# Patient Record
Sex: Male | Born: 2000 | Race: White | Hispanic: No | Marital: Single | Smoking: Never smoker
Health system: Southern US, Community
[De-identification: ages and names within clinical notes are randomized; demographics above are authoritative.]

## PROBLEM LIST (undated history)

## (undated) DIAGNOSIS — F329 Major depressive disorder, single episode, unspecified: Secondary | ICD-10-CM

## (undated) DIAGNOSIS — F32A Depression, unspecified: Secondary | ICD-10-CM

---
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
## 2018-04-27 ENCOUNTER — Encounter: Payer: Self-pay | Admitting: *Deleted

## 2018-04-28 ENCOUNTER — Ambulatory Visit: Payer: Self-pay | Admitting: Cardiovascular Disease

## 2018-05-02 ENCOUNTER — Ambulatory Visit (HOSPITAL_COMMUNITY)
Admission: RE | Admit: 2018-05-02 | Discharge: 2018-05-02 | Disposition: A | Discharge status: Home / Self Care | Payer: Self-pay | Source: Ambulatory Visit | Attending: Internal Medicine | Admitting: Internal Medicine

## 2018-05-02 ENCOUNTER — Other Ambulatory Visit (HOSPITAL_COMMUNITY): Payer: Self-pay | Admitting: Internal Medicine

## 2018-05-02 DIAGNOSIS — M5440 Lumbago with sciatica, unspecified side: Secondary | ICD-10-CM

## 2018-05-02 DIAGNOSIS — M545 Low back pain: Secondary | ICD-10-CM | POA: Insufficient documentation

## 2018-05-02 DIAGNOSIS — R0602 Shortness of breath: Secondary | ICD-10-CM

## 2018-11-17 IMAGING — DX DG CHEST 2V
2 series · 2 of 2 positions shown · non-contrast
Comparison: None.

CLINICAL DATA: Upper and lower back pain 6 months. Right-sided
chest pain. Currently vapes.

EXAM:
CHEST - 2 VIEW

[chest pa]
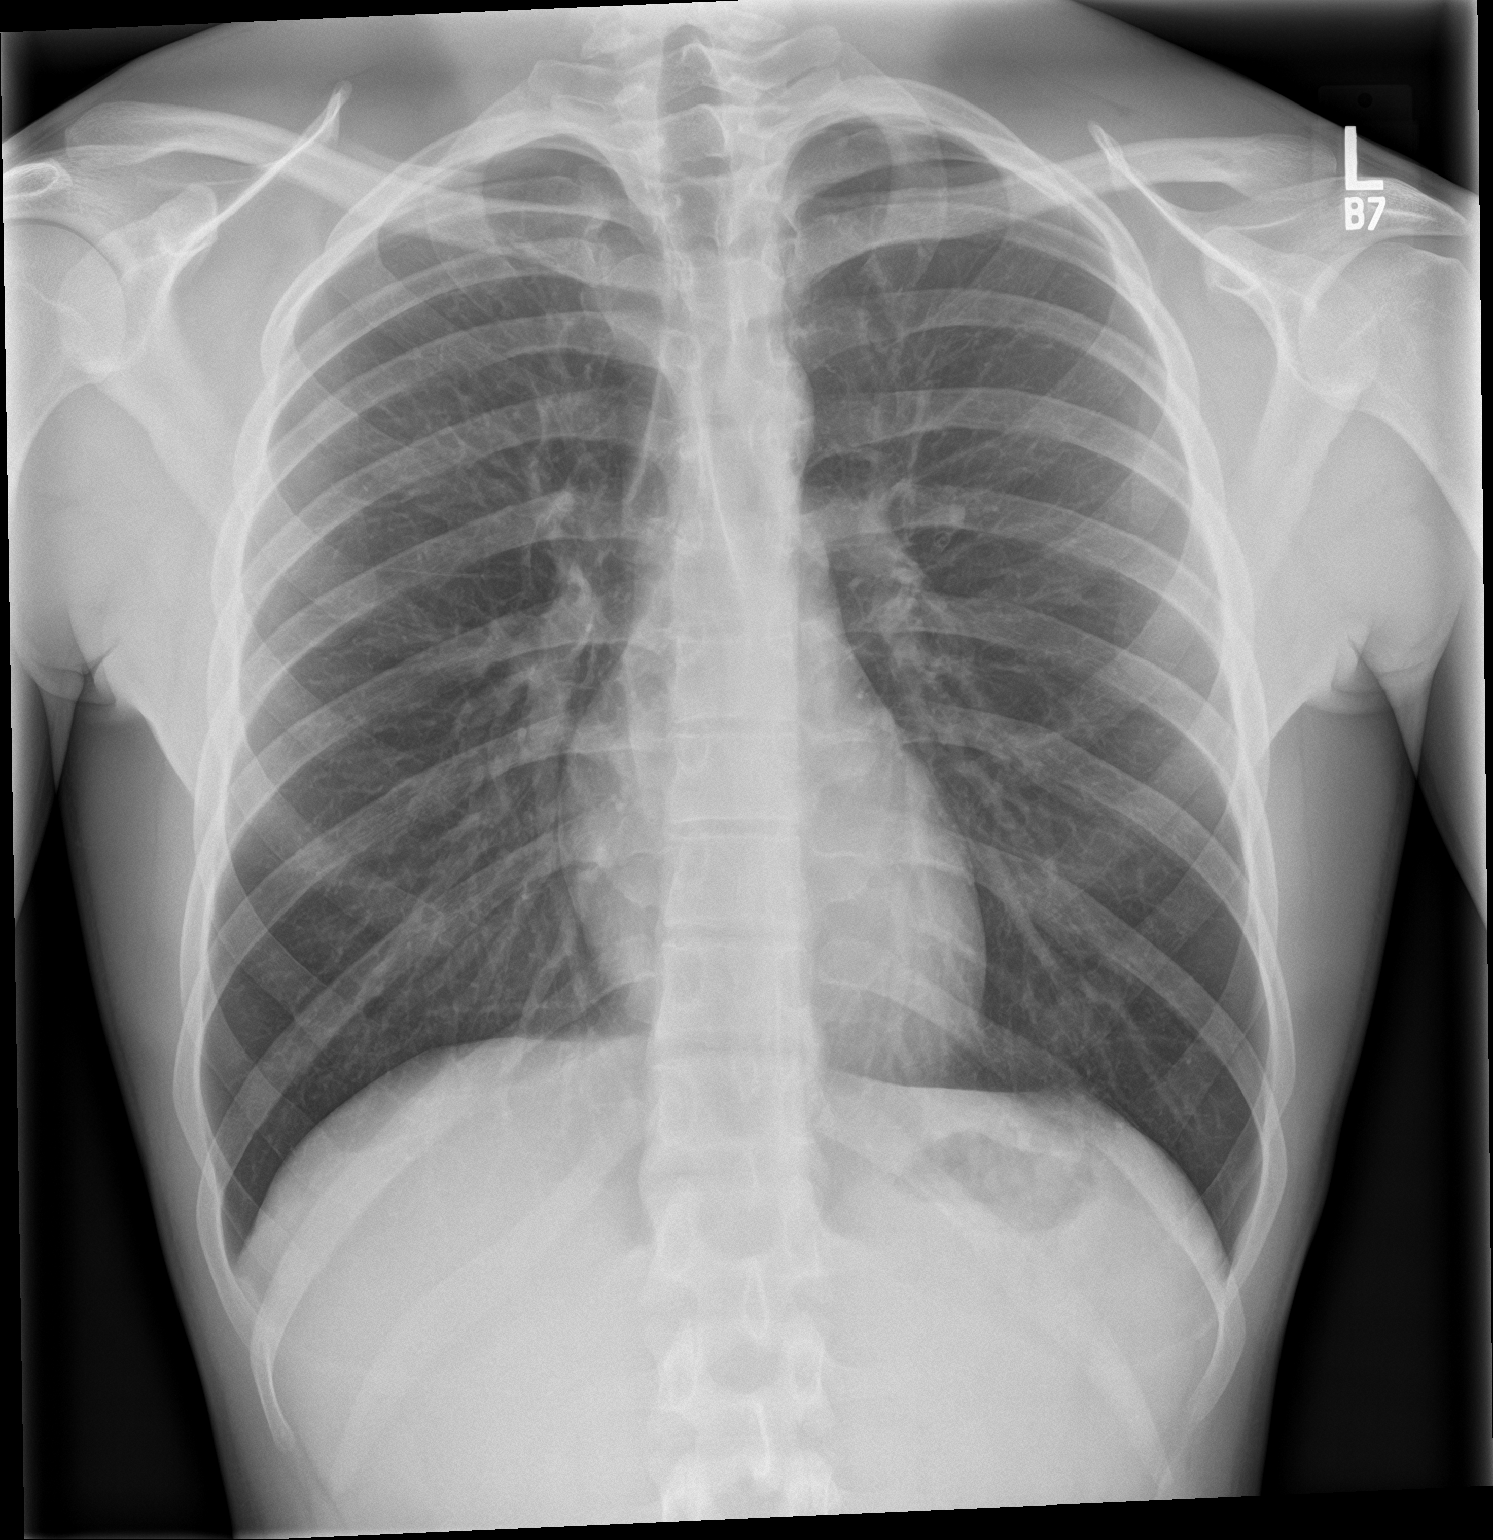

[chest lat]
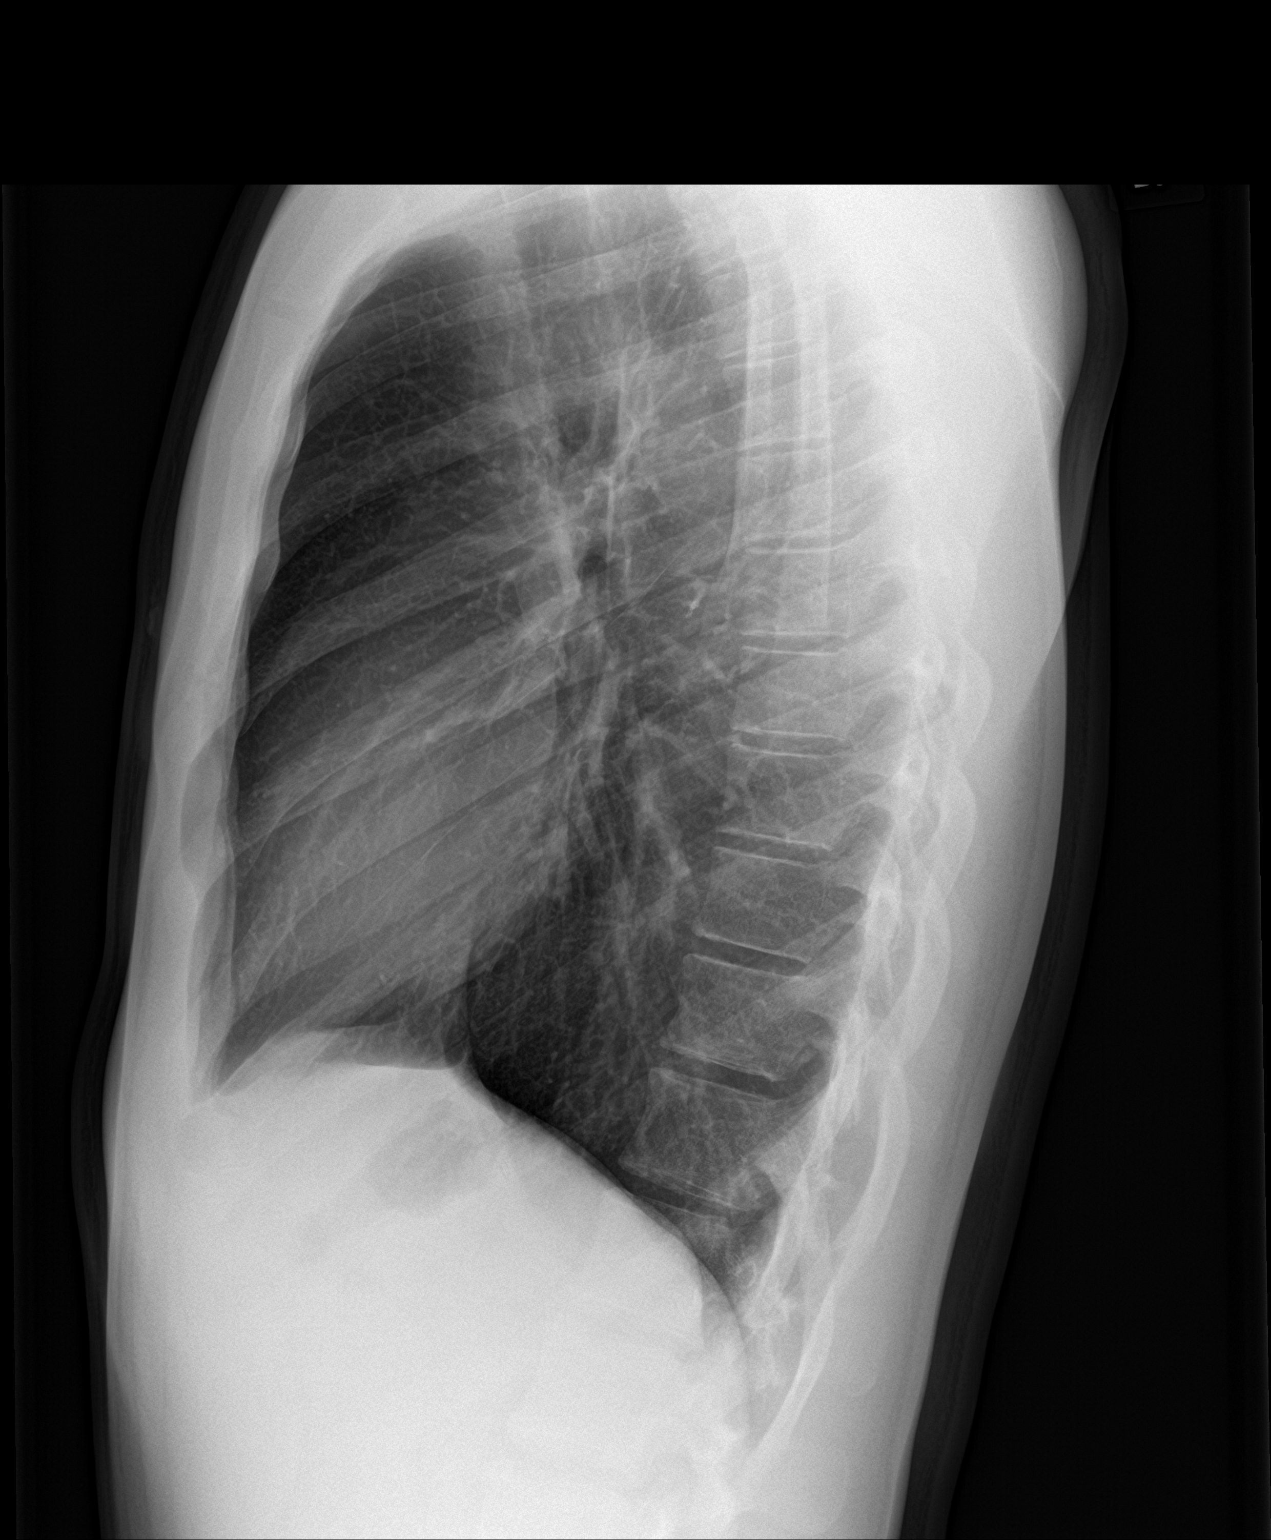

[2 of 2 positions shown; findings below may reference images not displayed]

FINDINGS: The heart size and mediastinal contours are within normal limits.
Both lungs are clear. The visualized skeletal structures are
unremarkable.
IMPRESSION: No active cardiopulmonary disease.

## 2020-04-23 ENCOUNTER — Other Ambulatory Visit: Payer: Medicaid Other

## 2020-04-23 ENCOUNTER — Other Ambulatory Visit: Payer: Self-pay

## 2020-04-23 DIAGNOSIS — Z20822 Contact with and (suspected) exposure to covid-19: Secondary | ICD-10-CM

## 2020-04-25 LAB — SARS-COV-2, NAA 2 DAY TAT

## 2020-04-25 LAB — NOVEL CORONAVIRUS, NAA: SARS-CoV-2, NAA: NOT DETECTED

## 2021-01-04 ENCOUNTER — Encounter (HOSPITAL_COMMUNITY): Payer: Self-pay | Admitting: Nurse Practitioner

## 2021-01-04 ENCOUNTER — Inpatient Hospital Stay (HOSPITAL_COMMUNITY)
Admission: RE | Admit: 2021-01-04 | Discharge: 2021-01-10 | DRG: 885 | Disposition: A | Discharge status: Home / Self Care | Payer: No Typology Code available for payment source | Attending: Psychiatry | Admitting: Psychiatry

## 2021-01-04 ENCOUNTER — Other Ambulatory Visit: Payer: Self-pay

## 2021-01-04 DIAGNOSIS — Z20822 Contact with and (suspected) exposure to covid-19: Secondary | ICD-10-CM | POA: Diagnosis present

## 2021-01-04 DIAGNOSIS — F411 Generalized anxiety disorder: Secondary | ICD-10-CM | POA: Diagnosis present

## 2021-01-04 DIAGNOSIS — Z818 Family history of other mental and behavioral disorders: Secondary | ICD-10-CM

## 2021-01-04 DIAGNOSIS — Z9151 Personal history of suicidal behavior: Secondary | ICD-10-CM

## 2021-01-04 DIAGNOSIS — F332 Major depressive disorder, recurrent severe without psychotic features: Principal | ICD-10-CM | POA: Diagnosis present

## 2021-01-04 DIAGNOSIS — G4700 Insomnia, unspecified: Secondary | ICD-10-CM | POA: Diagnosis present

## 2021-01-04 LAB — RAPID URINE DRUG SCREEN, HOSP PERFORMED
Amphetamines: NOT DETECTED
Barbiturates: NOT DETECTED
Benzodiazepines: NOT DETECTED
Cocaine: NOT DETECTED
Opiates: NOT DETECTED
Tetrahydrocannabinol: POSITIVE — AB

## 2021-01-04 LAB — CBC
HCT: 47 % (ref 39.0–52.0)
Hemoglobin: 15.3 g/dL (ref 13.0–17.0)
MCH: 29.8 pg (ref 26.0–34.0)
MCHC: 32.6 g/dL (ref 30.0–36.0)
MCV: 91.6 fL (ref 80.0–100.0)
Platelets: 217 10*3/uL (ref 150–400)
RBC: 5.13 MIL/uL (ref 4.22–5.81)
RDW: 12.4 % (ref 11.5–15.5)
WBC: 7.7 10*3/uL (ref 4.0–10.5)
nRBC: 0 % (ref 0.0–0.2)

## 2021-01-04 LAB — LIPID PANEL
Cholesterol: 128 mg/dL (ref 0–200)
HDL: 44 mg/dL (ref >40)
LDL Cholesterol: 68 mg/dL (ref 0–99)
Total CHOL/HDL Ratio: 2.9 RATIO
Triglycerides: 80 mg/dL (ref <150)
VLDL: 16 mg/dL (ref 0–40)

## 2021-01-04 LAB — COMPREHENSIVE METABOLIC PANEL
ALT: 13 U/L (ref 0–44)
AST: 15 U/L (ref 15–41)
Albumin: 4.5 g/dL (ref 3.5–5.0)
Alkaline Phosphatase: 57 U/L (ref 38–126)
Anion gap: 6 (ref 5–15)
BUN: 19 mg/dL (ref 6–20)
CO2: 34 mmol/L — ABNORMAL HIGH (ref 22–32)
Calcium: 9.6 mg/dL (ref 8.9–10.3)
Chloride: 101 mmol/L (ref 98–111)
Creatinine, Ser: 0.98 mg/dL (ref 0.61–1.24)
GFR, Estimated: >60 mL/min (ref >60)
Glucose, Bld: 97 mg/dL (ref 70–99)
Potassium: 3.6 mmol/L (ref 3.5–5.1)
Sodium: 141 mmol/L (ref 135–145)
Total Bilirubin: 1.7 mg/dL — ABNORMAL HIGH (ref 0.3–1.2)
Total Protein: 7.3 g/dL (ref 6.5–8.1)

## 2021-01-04 LAB — RESP PANEL BY RT-PCR (FLU A&B, COVID) ARPGX2
Influenza A by PCR: NEGATIVE
Influenza B by PCR: NEGATIVE
SARS Coronavirus 2 by RT PCR: NEGATIVE

## 2021-01-04 LAB — ETHANOL: Alcohol, Ethyl (B): <10 mg/dL (ref <10)

## 2021-01-04 LAB — TSH: TSH: 0.595 u[IU]/mL (ref 0.350–4.500)

## 2021-01-04 MED ORDER — TRAZODONE HCL 50 MG PO TABS
50.0000 mg | ORAL_TABLET | Freq: Every evening | ORAL | Status: DC | PRN
  Filled 2021-01-04: qty 1

## 2021-01-04 MED ORDER — HYDROXYZINE HCL 25 MG PO TABS
25.0000 mg | ORAL_TABLET | Freq: Three times a day (TID) | ORAL | Status: DC | PRN
  Administered 2021-01-04 – 2021-01-05 (×2): 25 mg via ORAL
  Filled 2021-01-04 (×4): qty 1

## 2021-01-04 MED ORDER — ACETAMINOPHEN 325 MG PO TABS: 650.0000 mg | ORAL_TABLET | Freq: Four times a day (QID) | ORAL | Status: DC | PRN

## 2021-01-04 MED ORDER — CITALOPRAM HYDROBROMIDE 20 MG PO TABS
20.0000 mg | ORAL_TABLET | Freq: Every day | ORAL | Status: DC
  Administered 2021-01-04 – 2021-01-10 (×7): 20 mg via ORAL
  Filled 2021-01-04 (×9): qty 1

## 2021-01-04 MED ORDER — ALUM & MAG HYDROXIDE-SIMETH 200-200-20 MG/5ML PO SUSP: 30.0000 mL | ORAL | Status: DC | PRN

## 2021-01-04 MED ORDER — MAGNESIUM HYDROXIDE 400 MG/5ML PO SUSP: 30.0000 mL | Freq: Every day | ORAL | Status: DC | PRN

## 2021-01-04 MED ORDER — TRAZODONE HCL 50 MG PO TABS
25.0000 mg | ORAL_TABLET | Freq: Every evening | ORAL | Status: DC | PRN
  Administered 2021-01-07: 25 mg via ORAL
  Filled 2021-01-04 (×2): qty 1

## 2021-01-04 MED ORDER — NICOTINE POLACRILEX 2 MG MT GUM
CHEWING_GUM | OROMUCOSAL | Status: AC
  Administered 2021-01-04: 2 mg
  Filled 2021-01-04: qty 1

## 2021-01-04 MED ORDER — NICOTINE POLACRILEX 2 MG MT GUM: 2.0000 mg | CHEWING_GUM | OROMUCOSAL | Status: DC | PRN

## 2021-01-04 NOTE — BH Assessment (Signed)
Comprehensive Clinical Assessment (CCA) Note  01/04/2021 [facility] [id number]  DISPOSITION: Completed CCA accompanied by Chad Bering, NP who completed MSE. Pt meets criteria for inpatient psychiatric treatment and Chad Williamson states Pt is accepted to the service of Dr. Jola [facility], room 305-1.  The patient demonstrates the following risk factors for suicide: Chronic risk factors for suicide include: psychiatric disorder of major depressive disorder and previous self-harm by cutting his arm and abdomen. Acute risk factors for suicide include: family or marital conflict, unemployment and social withdrawal/isolation. Protective factors for this patient include: positive social support and responsibility to others (children, family). Considering these factors, the overall suicide risk at this point appears to be high. Patient is not appropriate for outpatient follow up.  Flowsheet Row Admission (Current) from [facility]  C-SSRS RISK CATEGORY High Risk     Pt is a 20 year old single male who presents to [facility] accompanied by his mother, Chad [facility] [phone] who did not participate in assessment. Pt reports he has experienced symptoms of depression since childhood and his symptoms have worsened over the past two weeks. Pt says he came to [facility] "because my mental state is not good." Pt has numerous superficial self-inflicted lacerations on his left arm and says he also has lacerations on his abdomen. He states he has began cutting at age 76 and last cut two days ago with scissors. He report current suicidal ideation with plan to walk into traffic. Pt says he attempted suicide as a child and Pt's medical record indicates he overdosed in 11-19-12. Pt describes his mood as sad and depressed. Pt acknowledges symptoms including crying spells, social withdrawal, loss of interest in usual pleasures, fatigue, irritability, decreased concentration,  decreased sleep, decreased appetite and feelings of worthlessness. Pt says he feels "invisible". He states he has recently lost 35 pound due to poor appetite and eating. He denies current homicidal ideation or history of violence. He denies auditory or visual hallucinations. Pt reports he has smoked marijuana daily for years and denies use of other substances.  Pt identifies several stressors and says he believes he is the source of his own stress. He says the mother of his 55 month old daughter is not his girlfriend and he would like to be closer. 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 Pt says he was living with his mother and younger brother but moved in with his grandmother a few days ago. He says he did not see his father frequently and that his father died in Nov 20, 2014. Pt's mother reports Pt's father was diagnosed with schizophrenia. Pt denies history of physical, sexual or verbal abuse. Pt denies access to firearms.  Pt states he has no outpatient mental health providers and is not taking any psychiatric medications. He says he was prescribed Prozac, Wellbutrin, and Adderall as a child but has not taken medications in years. He has no history of inpatient psychiatric treatment.  Pt is casually dressedm alert and oriented x4. Pt speaks in a clear tone, at low volume and mildly rapid pace. Motor behavior appears normal. Eye contact is good. Pt's mood is depressed and anxious, affect is congruent with mood. Thought process is coherent and relevant. There is no indication Pt is currently responding to internal stimuli or experiencing delusional thought content. Pt was cooperative throughout assessment. He says he is willing to sign  voluntarily into [facility]. Pt's mother expresses concern for Pt's wellbeing and believes he needs psychiatric hospitalization.   Chief Complaint:  Chief Complaint  Patient presents with  . Psychiatric  Evaluation   Visit Diagnosis: F33.2 Major depressive disorder, Recurrent episode, Severe   CCA Screening, Triage and Referral (STR)  Patient Reported Information How did you hear about [place]? Other (Comment) Mudlogger)  Referral name: No data recorded Referral phone number: No data recorded  Whom do you see for routine medical problems? I don't have a doctor  Practice/Facility Name: No data recorded Practice/Facility Phone Number: No data recorded Name of Contact: No data recorded Contact Number: No data recorded Contact Fax Number: No data recorded Prescriber Name: No data recorded Prescriber Address (if known): No data recorded  What Is the Reason for Your Visit/Call Today? Pt reports depressive symptoms and suicidal ideation  How Long Has This Been Causing You Problems? > than 6 months  What Do You Feel Would Help You the Most Today? Treatment for Depression or other mood problem   Have You Recently Been in Any Inpatient Treatment (Hospital/Detox/Crisis [facility]/28-Day Program)? No  Name/Location of Program/Hospital:No data recorded How Long Were You There? No data recorded When Were You Discharged? No data recorded  Have You Ever Received Services From [facility] Before? Yes  Who Do You See at [facility]? Outpatient medical services   Have You Recently Had Any Thoughts About Hurting Yourself? Yes  Are You Planning to Commit Suicide/Harm Yourself At This time? Yes   Have you Recently Had Thoughts About Hurting Someone Karolee Ohs? No  Explanation: No data recorded  Have You Used Any Alcohol or Drugs in the Past 24 Hours? Yes  How Long Ago Did You Use Drugs or Alcohol? No data recorded What Did You Use and How Much? Marijuana   Do You Currently Have a Therapist/Psychiatrist? No  Name of Therapist/Psychiatrist: No data recorded  Have You Been Recently Discharged From Any Office Practice or Programs? No  Explanation of Discharge From Practice/Program: No  data recorded    CCA Screening Triage Referral Assessment Type of Contact: Face-to-Face  Is this Initial or Reassessment? No data recorded Date Telepsych consult ordered in [facility]:  No data recorded Time Telepsych consult ordered in [facility]:  No data recorded  Patient Reported Information Reviewed? Yes  Patient Left Without Being Seen? No data recorded Reason for Not Completing Assessment: No data recorded  Collateral Involvement: Mother: Chad [facility] [phone]   Does Patient Have a Court Appointed Legal Guardian? No data recorded Name and Contact of Legal Guardian: No data recorded If Minor and Not Living with Parent(s), Who has Custody? NA  Is CPS involved or ever been involved? Never  Is APS involved or ever been involved? Never   Patient Determined To Be At Risk for Harm To Self or Others Based on Review of Patient Reported Information or Presenting Complaint? Yes, for Self-Harm  Method: No data recorded Availability of Means: No data recorded Intent: No data recorded Notification Required: No data recorded Additional Information for Danger to Others Potential: No data recorded Additional Comments for Danger to Others Potential: No data recorded Are There Guns or Other Weapons in Your Home? No data recorded Types of Guns/Weapons: No data recorded Are These Weapons Safely Secured?                            No data recorded Who Could Verify You Are Able  To Have These Secured: No data recorded Do You Have any Outstanding Charges, Pending Court Dates, Parole/Probation? No data recorded Contacted To Inform of Risk of Harm To Self or Others: Family/Significant Other:   Location of Assessment: [facility]   Does Patient Present under Involuntary Commitment? No  IVC Papers Initial File Date: No data recorded  [place] of Residence: [place]   Patient Currently Receiving the Following Services: Not Receiving Services   Determination of Need: Urgent (48  hours)   Options For Referral: Inpatient Hospitalization     CCA Biopsychosocial Intake/Chief Complaint:  Pt reports depressive symptoms including suicidal ideation with plan.  Current Symptoms/Problems: Pt reports suicidal ideation with plan to walk into traffic, crying spells, social withdrawal, loss of interest in usual pleasures, decreased motivation, poor sleep, decreased eating with weight loss, feelings of worthlessness.   Patient Reported Schizophrenia/Schizoaffective Diagnosis in Past: No   Strengths: Pt is motivated for treatment.  Preferences: None identified  Abilities: Pt reports he plays music.   Type of Services Patient Feels are Needed: Pt states he does not know what kind of help he needs   Initial Clinical Notes/Concerns: NA   Mental Health Symptoms Depression:  Change in energy/activity; Difficulty Concentrating; Fatigue; Increase/decrease in appetite; Irritability; Sleep (too much or little); Tearfulness; Weight gain/loss; Worthlessness   Duration of Depressive symptoms: Greater than two weeks   Mania:  Change in energy/activity; Irritability   Anxiety:   Difficulty concentrating; Fatigue; Irritability; Restlessness; Sleep; Tension; Worrying   Psychosis:  None   Duration of Psychotic symptoms: No data recorded  Trauma:  None   Obsessions:  None   Compulsions:  None   Inattention:  N/A   Hyperactivity/Impulsivity:  N/A   Oppositional/Defiant Behaviors:  N/A   Emotional Irregularity:  None   Other Mood/Personality Symptoms:  NA    Mental Status Exam Appearance and self-care  Stature:  Average   Weight:  Thin   Clothing:  Casual   Grooming:  Normal   Cosmetic use:  None   Posture/gait:  Normal   Motor activity:  Not Remarkable   Sensorium  Attention:  Distractible   Concentration:  Anxiety interferes   Orientation:  X5   Recall/memory:  Normal   Affect and Mood  Affect:  Anxious; Depressed   Mood:  Depressed    Relating  Eye contact:  Normal   Facial expression:  Anxious; Depressed   Attitude toward examiner:  Cooperative   Thought and Language  Speech flow: Normal   Thought content:  Appropriate to Mood and Circumstances   Preoccupation:  None   Hallucinations:  None   Organization:  No data recorded  [employer] of Knowledge:  Average   Intelligence:  Average   Abstraction:  Normal   Judgement:  Fair   Reality Testing:  Adequate   Insight:  Fair   Decision Making:  Vacilates   Social Functioning  Social Maturity:  Isolates   Social Judgement:  Normal   Stress  Stressors:  Relationship; Financial; Work   Coping Ability:  Contractor Deficits:  None   Supports:  Family; Friends/Service system     Religion: Religion/Spirituality Are You A Religious Person?: No How Might This Affect Treatment?: NA  Leisure/Recreation: Leisure / Recreation Do You Have Hobbies?: Yes Leisure and Hobbies: Music, video games  Exercise/Diet: Exercise/Diet Do You Exercise?: No Have You Gained or Lost A Significant Amount of Weight in the Past Six Months?: Yes-Lost Number of Pounds  Lost?: 35 Do You Follow a Special Diet?: No Do You Have Any Trouble Sleeping?: Yes Explanation of Sleeping Difficulties: Averaging 2-6 hours   CCA Employment/Education Employment/Work Situation: Employment / Work Situation Employment situation: Unemployed Patient's job has been impacted by current illness: Yes Describe how patient's job has been impacted: Pt reports he did not show for job interview due to depressive symptoms What is the longest time patient has a held a job?: Unknown Where was the patient employed at that time?: Unknown Has patient ever been in the [employer]?: No  Education: Education Is Patient Currently Attending School?: No Last Grade Completed: 10 (Obtained GED) Name of High School: NA Did Garment/textile technologist From [employer]?: No (GED) Did You Attend  College?: Yes What Type of College Degree Do you Have?: [employer] Did [place] Attend Graduate School?: No What Was Your Major?: NA Did You Have Any Special Interests In School?: NA Did You Have An Individualized Education Program (IIEP): No Did You Have Any Difficulty At School?: Yes Were Any Medications Ever Prescribed For These Difficulties?: Yes Medications Prescribed For School Difficulties?: ADHD Patient's Education Has Been Impacted by Current Illness: No   CCA Family/Childhood History Family and Relationship History: Family history Marital status: Single Are you sexually active?: Yes What is your sexual orientation?: Heterosexual Has your sexual activity been affected by drugs, alcohol, medication, or emotional stress?: No Does patient have children?: Yes How many children?: 1 How is patient's relationship with their children?: Good relationship with 5 month old daughter  Childhood History:  Childhood History By whom was/is the patient raised?: Mother Additional childhood history information: Pt's mother reports Pt's father was diagnosed with schizophrenia Description of patient's relationship with caregiver when they were a child: Good Patient's description of current relationship with people who raised him/her: Pt states he believes his mother is supportive. Does patient have siblings?: Yes Number of Siblings: 5 Description of patient's current relationship with siblings: 1 brother, 2 half brothers, 2 half sisters Did patient suffer any verbal/emotional/physical/sexual abuse as a child?: No Did patient suffer from severe childhood neglect?: No Has patient ever been sexually abused/assaulted/raped as an adolescent or adult?: No Was the patient ever a victim of a crime or a disaster?: No Witnessed domestic violence?: No Has patient been affected by domestic violence as an adult?: No  Child/Adolescent Assessment:     CCA Substance Use Alcohol/Drug  Use: Alcohol / Drug Use Pain Medications: Denies abuse Prescriptions: Denies abuse Over the Counter: Denies abuse History of alcohol / drug use?: Yes Longest period of sobriety (when/how long): Unknown Negative Consequences of Use:  (None) Withdrawal Symptoms:  (None) Substance #1 Name of Substance 1: Marijuana 1 - Age of First Use: 11 1 - Amount (size/oz): "One bong hit" 1 - Frequency: Daily 1 - Duration: Ongoing for years 1 - Last Use / Amount: 01/03/2021 1 - Method of Aquiring: Unknown 1- Route of Use: Smoking                       ASAM's:  Six Dimensions of Multidimensional Assessment  Dimension 1:  Acute Intoxication and/or Withdrawal Potential:   Dimension 1:  Description of individual's past and current experiences of substance use and withdrawal: Pt denies withdrawal symptoms  Dimension 2:  Biomedical Conditions and Complications:   Dimension 2:  Description of patient's biomedical conditions and  complications: No medical problems  Dimension 3:  Emotional, Behavioral, or Cognitive Conditions and Complications:  Dimension 3:  Description of emotional, behavioral, or cognitive conditions and complications: Pt reports depressive symptoms  Dimension 4:  Readiness to Change:  Dimension 4:  Description of Readiness to Change criteria: Contemplation stage  Dimension 5:  Relapse, Continued use, or Continued Problem Potential:  Dimension 5:  Relapse, continued use, or continued problem potential critiera description: Pt has not attempted to stop using marijuana  Dimension 6:  Recovery/Living Environment:  Dimension 6:  Recovery/Iiving environment criteria description: Living with grandmother  ASAM Severity Score: ASAM's Severity Rating Score: 8  ASAM Recommended Level of Treatment: ASAM Recommended Level of Treatment: Level I Outpatient Treatment   Substance use Disorder (SUD) Substance Use Disorder (SUD)  Checklist Symptoms of Substance Use: Continued use despite  persistent or recurrent social, interpersonal problems, caused or exacerbated by use,Social, occupational, recreational activities given up or reduced due to use  Recommendations for Services/Supports/Treatments: Recommendations for Services/Supports/Treatments Recommendations For Services/Supports/Treatments: Individual Therapy,Inpatient Hospitalization  DSM5 Diagnoses: Patient Active Problem List   Diagnosis Date Noted  . Right hand fracture 03/27/2013    Patient Centered Plan: Patient is on the following Treatment Plan(s):  Anxiety, Depression and Substance Abuse   Referrals to Alternative Service(s): Referred to Alternative Service(s):   Place:   Date:   Time:    Referred to Alternative Service(s):   Place:   Date:   Time:    Referred to Alternative Service(s):   Place:   Date:   Time:    Referred to Alternative Service(s):   Place:   Date:   Time:     Pamalee LeydenWarrick Jr, Surah Pelley Ellis, [facility]

## 2021-01-04 NOTE — [facility] Group Notes (Signed)
[facility] LCSW Group Therapy Note  01/04/2021    Type of Therapy and Topic:  Group Therapy:  A Hero Worthy of Support  Participation Level:  Active   Description of Group:  Patients in this group were introduced to the concept that additional supports including self-support are an essential part of recovery.  Matching needs with supports to help fulfill those needs was explained.  Establishing boundaries that can gradually be increased or decreased was described, with patients giving their own examples of establishing appropriate boundaries in their lives.  A song entitled "My Own Hero" was played and a group discussion ensued in which patients stated it inspired them to help themselves in order to succeed, because other people cannot achieve their goals such as sobriety or stability for them.  A song was played called "I Am Enough" which led to a discussion about being willing to believe we are worth the effort of being a self-support.   Therapeutic Goals: 1)  demonstrate the importance of being a key part of one's own support system 2)  discuss various available supports 3)  encourage patient to use music as part of their self-support and focus on goals 4)  elicit ideas from patients about supports that need to be added   Summary of Patient Progress:  The patient expressed that he did not know yet what supports he would need to add at discharge.  He talked frequently during group, although reluctantly.  He was encouraged each time to speak up, that what he had to say was important.  He talked about feeling that he cannot trust anyone, so has to do things for himself.  He showed some insight when this was challenged with an explanation.  Therapeutic Modalities:   Motivational Interviewing Activity  Lynnell Chad

## 2021-01-04 NOTE — [facility] Suicide Risk Assessment (Signed)
[facility] Admission Suicide Risk Assessment   Nursing information obtained from:  Patient Demographic factors:  Male,Adolescent or young adult,Low socioeconomic status Current Mental Status:  Suicidal ideation indicated by patient,Suicidal ideation indicated by others,Self-harm thoughts,Self-harm behaviors Loss Factors:  Decrease in vocational status,Loss of significant relationship,Financial problems / change in socioeconomic status Historical Factors:  Prior suicide attempts,Family history of mental illness or substance abuse,Impulsivity Risk Reduction Factors:  Responsible for children under 39 years of age,Employed,Sense of responsibility to family,Living with another person, especially a relative  Total Time spent with patient: 30 minutes Principal Problem: <principal problem not specified> Diagnosis:  Active Problems:   MDD (major depressive disorder), recurrent severe, without psychosis (HCC)  Subjective Data: Patient is seen and examined.  Patient is a 20 year old male who presented as a walk-in assessment to the behavioral health hospital on 01/04/2021.  The patient was accompanied by his mother.  She did not participate in the evaluation.  The patient reported that he had had depression since childhood, and his symptoms had worsened over the last 2 weeks.  He stated at that time his mental state was not good, and had numerous superficial self-inflicted lacerations on his left arm.  He admitted that he had been cutting himself since age 82, and he cut himself 2 days ago with scissors.  He reported suicidal ideation to walk into traffic.  He acknowledged that he had had suicide attempts as a child.  He had overdosed in 2014, but on my discussion with him today stated "it was only a couple of pills".  He stated that his most recent stressors was that he was living with his significant other and their child.  The child was just several months old.  He reported that he was angry, hostile and got into  arguments easily.  It got to the point where the male asked him to leave the home.  He went out and spent the night in a car in front of the house, and was hoping that the conflict would be resolved, but the significant other would not allow him to return.  He then went to his mother's and stayed there for a couple of days, but "that did not work out either".  He then stated he went to stay with his grandmother, but he did not feel any better there and left.  He stated he was not working currently.  He stated that as a child he was treated at the [facility] facility, and had been on medications, but did not believe that they were effective.  He stated that he had a family history of schizophrenia in his father, but denied any auditory or visual hallucinations.  He was admitted to the hospital for evaluation and stabilization.  Continued Clinical Symptoms:  Alcohol Use Disorder Identification Test Final Score (AUDIT): 0 The "Alcohol Use Disorders Identification Test", Guidelines for Use in Primary Care, Second Edition.  World Science writer [facility]). Score between 0-7:  no or low risk or alcohol related problems. Score between 8-15:  moderate risk of alcohol related problems. Score between 16-19:  high risk of alcohol related problems. Score 20 or above:  warrants further diagnostic evaluation for alcohol dependence and treatment.   CLINICAL FACTORS:   Depression:   Anhedonia Hopelessness Impulsivity Insomnia   Musculoskeletal: Strength & Muscle Tone: within normal limits Gait & Station: normal Patient leans: N/A  Psychiatric Specialty Exam:  Presentation  General Appearance: Disheveled  Eye Contact:Fair  Speech:Normal Rate  Speech Volume:Decreased  Handedness:Right  Mood and Affect  Mood:Depressed  Affect:Congruent   Thought Process  Thought Processes:Coherent  Descriptions of Associations:Circumstantial  Orientation:Full (Time, Place and Person)  Thought  Content:Logical  History of Schizophrenia/Schizoaffective disorder:No  Duration of Psychotic Symptoms:No data recorded Hallucinations:Hallucinations: None  Ideas of Reference:None  Suicidal Thoughts:Suicidal Thoughts: Yes, Passive SI Active Intent and/or Plan: Without Intent  Homicidal Thoughts:Homicidal Thoughts: No   Sensorium  Memory:Immediate Fair; Recent Fair; Remote Fair  Judgment:Fair  Insight:Lacking   Executive Functions  Concentration:Fair  Attention Span:Fair  Recall:Poor  Fund of Knowledge:Fair  Language:Fair   Psychomotor Activity  Psychomotor Activity:Psychomotor Activity: Decreased   Assets  Assets:Desire for Improvement; Resilience   Sleep  Sleep:Sleep: Poor Number of Hours of Sleep: -1    Physical Exam: Physical Exam Vitals and nursing note reviewed.  Constitutional:      Appearance: Normal appearance.  HENT:     Head: Normocephalic and atraumatic.  Pulmonary:     Effort: Pulmonary effort is normal.  Neurological:     General: No focal deficit present.     Mental Status: He is alert and oriented to person, place, and time.    Review of Systems  All other systems reviewed and are negative.  Blood pressure 102/68, pulse 90, temperature 98.5 F (36.9 C), temperature source Oral, resp. rate 12, height 5\' 9"  (1.753 m), weight 58.5 kg, SpO2 99 %. Body mass index is 19.05 kg/m.   COGNITIVE FEATURES THAT CONTRIBUTE TO RISK:  None    SUICIDE RISK:   Moderate:  Frequent suicidal ideation with limited intensity, and duration, some specificity in terms of plans, no associated intent, good self-control, limited dysphoria/symptomatology, some risk factors present, and identifiable protective factors, including available and accessible social support.  PLAN OF CARE: Patient is seen and examined.  Patient is a 20 year old male with the above-stated past psychiatric history who was admitted secondary to worsening depression and suicidal  ideation.  He will be admitted to the hospital.  He will be integrated in the milieu.  He will be encouraged to attend groups.  We will start him on Celexa 20 mg p.o. daily.  He will also have available hydroxyzine for anxiety as well as trazodone for sleep.  Review of his admission laboratories revealed normal electrolytes including creatinine and liver function enzymes.  Lipid panel was normal.  CBC was normal.  TSH was 0.595.  Respiratory panel was negative for influenza A, B or coronavirus.  Blood alcohol was less than 10, drug screen was positive for marijuana.  His vital signs are stable, he is afebrile.  I certify that inpatient services furnished can reasonably be expected to improve the patient's condition.   21, MD 01/04/2021, 10:48 AM

## 2021-01-04 NOTE — Progress Notes (Signed)
Pt reports increasing irritation, states that he feels sad at night.  Reported that armband was "agitating" him and requested to  have it removed.  Pt agreed to take Vistaril PRN but refused Trazodone because he didn't want to feel "too drugged".  Pt states that he doesn't like to take medications if he doesn't have to and asked that dose of Trazodone be reduced if he does have to take it for sleep.  Pt did attend evening group but appeared to have difficulty in loud environments at times.  He reports auditory hallucinations that he describes as "noise".      01/04/21 2335  Psych Admission Type (Psych Patients Only)  Admission Status Voluntary  Psychosocial Assessment  Patient Complaints Anxiety;Agitation  Eye Contact Fair  Facial Expression Anxious  Affect Appropriate to circumstance;Anxious  Speech Logical/coherent  Interaction Assertive  Motor Activity Fidgety  Appearance/Hygiene Unremarkable  Behavior Characteristics Appropriate to situation;Anxious  Mood Anxious;Pleasant  Thought Process  Coherency WDL  Content WDL  Delusions None reported or observed  Perception WDL  Hallucination Auditory (Pt describes as "noises")  Judgment Impaired  Confusion None  Danger to Self  Current suicidal ideation? Denies  Danger to Others  Danger to Others None reported or observed

## 2021-01-04 NOTE — H&P (Signed)
Psychiatric Admission Assessment Adult  Patient Identification: Chad Williamson MRN:  [id number] Date of Evaluation:  01/04/2021 Chief Complaint: I feel like I need some help  MDD (major depressive disorder), recurrent severe, without psychosis (HCC) [F33.2] Principal Diagnosis:MDD (major depressive disorder), recurrent severe, without psychosis (HCC) [F33.2]   Diagnosis:  Active Problems:   MDD (major depressive disorder), recurrent severe, without psychosis (HCC)  History of Present Illness: Chad Williamson is a 20 y/o male presenting voluntarily with his mother [name] [phone]) [facility].  Irwin stated the [facility] is "I feel like I need some help". Lawson presented with a calm and cooperative demeanor but with a flat affect. Patient displayed some pressured speech and would ramble and forget the question he was answering and frequently have to say "what did you ask me". [name] stated that he has been having suicidal ideations and that he thought about jumping in front of a car. [name] has a history of a suicide attempt at age 20 by taking some pills which he states was due to a break-up with a girlfriend.  Patient stated that he was prescribed Prozac but it was not effective then it was stopped and he was prescribed Zoloft.  [name] stated that he cannot really remember if Zoloft was helpful but he does not like taking medicine so he stopped. During this time he was also [facility] a [facility]. [name] reports he was living with his mother and younger brother but felt like he wanted to be by himself and his grandmother needed help so he moved in with her. [name] endorsed having low motivation, anhedonia, self-inflicted stress, inability to sleep, not able to eat, and feelings of sadness. [name] reports that he has been tearful, sad and depressed and missing his 19102-month-old daughter. [name] reports he has been isolating himself from friends and family, missing work, easily annoyed and  irritable. [name] reports that he recently was offered a temporary job that would lead to a full time position but he just too depressed to go and he really wants to work. [name] reports that music is the only thing that makes him happy now. [name] also reports that he smokes marijuana pretty much every day but it has not had the same effect on him that he used to, so he is thinking about stopping.  [name] also stated, "I feel in visible and disconnected" and he was not able to clarify what he meant by that statement. Patient has visible cuts on his left arm and abdomen from cuts from a scissors.  Patient stated he last cut 3 days ago that he started cutting at the age of 20. Mother reports that Chad Williamson's father who is deceased was diagnosed with schizophrenia and patient was diagnosed with bipolar disorder. Patient stated that he was adderall as a child but it made feel like a zombie.   Associated Signs/Symptoms: Depression Symptoms:  depressed mood, anhedonia, feelings of worthlessness/guilt, difficulty concentrating, hopelessness, impaired memory, suicidal thoughts with specific plan, anxiety, disturbed sleep, Duration of Depression Symptoms: Greater than two weeks yes (Hypo) Manic Symptoms:  Distractibility, Elevated Mood, Impulsivity, Irritable Mood, Anxiety Symptoms:  Excessive Worry, Social Anxiety, Psychotic Symptoms:  None PTSD Symptoms: Negative NA Total Time spent with patient: 45 minutes  Past Psychiatric History:   Is the patient at risk to self? Yes.    Has the patient been a risk to self in the past 6 months? No.  Has the patient been a risk  to self within the distant past? Yes.    Is the patient a risk to others? No.  Has the patient been a risk to others in the past 6 months? No.  Has the patient been a risk to others within the distant past? No.   Prior Inpatient Therapy:no   Prior Outpatient Therapy: yes    Alcohol Screening: 1. How often do you have a drink containing  alcohol?: Never 2. How many drinks containing alcohol do you have on a typical day when you are drinking?: 1 or 2 3. How often do you have six or more drinks on one occasion?: Never AUDIT-C Score: 0 4. How often during the last year have you found that you were not able to stop drinking once you had started?: Never 5. How often during the last year have you failed to do what was normally expected from you because of drinking?: Never 6. How often during the last year have you needed a first drink in the morning to get yourself going after a heavy drinking session?: Never 7. How often during the last year have you had a feeling of guilt of remorse after drinking?: Never 9. Have you or someone else been injured as a result of your drinking?: No 10. Has a relative or friend or a doctor or another health worker been concerned about your drinking or suggested you cut down?: No Alcohol Use Disorder Identification Test Final Score (AUDIT): 0 Substance Abuse History in the last 12 months:  Yes.   Consequences of Substance Abuse: Withdrawal Symptoms:   None Previous Psychotropic Medications: Yes  Psychological Evaluations: Yes  Past Medical History:  Past Medical History:  Diagnosis Date  . Depression    History reviewed. No pertinent surgical history. Family History:  Family History  Problem Relation Age of Onset  . Asthma Father    Family Psychiatric  History: Father-schizophrenia Tobacco Screening: Have you used any form of tobacco in the last 30 days? (Cigarettes, Smokeless Tobacco, Cigars, and/or Pipes): Yes Tobacco use, Select all that apply: 5 or more cigarettes per day Are you interested in Tobacco Cessation Medications?: No, patient refused Counseled patient on smoking cessation including recognizing danger situations, developing coping skills and basic information about quitting provided: Refused/Declined practical counseling Social History: Lives with his grandmother,  unemployed Social History   Substance and Sexual Activity  Alcohol Use No     Social History   Substance and Sexual Activity  Drug Use Yes  . Types: Marijuana   Comment: last use 09/26/14    Additional Social History: Marital status: Single Are you sexually active?: Yes What is your sexual orientation?: Heterosexual Has your sexual activity been affected by drugs, alcohol, medication, or emotional stress?: No Does patient have children?: Yes How many children?: 1 How is patient's relationship with their children?: Good relationship with 77 month old daughter    Pain Medications: Denies abuse Prescriptions: Denies abuse Over the Counter: Denies abuse History of alcohol / drug use?: Yes Longest period of sobriety (when/how long): Unknown Negative Consequences of Use:  (None) Withdrawal Symptoms:  (None) Name of Substance 1: Marijuana 1 - Age of First Use: 11 1 - Amount (size/oz): "One bong hit" 1 - Frequency: Daily 1 - Duration: Ongoing for years 1 - Last Use / Amount: 01/03/2021 1 - Method of Aquiring: Unknown 1- Route of Use: Smoking                  Allergies:   Allergies  Allergen Reactions  . Shellfish Allergy Anaphylaxis and Rash  . Latex Rash and Other (See Comments)    Redness   Lab Results:  Results for orders placed or performed during the hospital encounter of 01/04/21 (from the past 48 hour(s))  Resp Panel by RT-PCR (Flu A&B, Covid) Nasopharyngeal Swab     Status: None   Collection Time: 01/04/21 12:26 AM   Specimen: Nasopharyngeal Swab; Nasopharyngeal(NP) swabs in vial transport medium  Result Value Ref Range   SARS Coronavirus 2 by RT PCR NEGATIVE NEGATIVE    Comment: (NOTE) SARS-CoV-2 target nucleic acids are NOT DETECTED.  The SARS-CoV-2 RNA is generally detectable in upper respiratory specimens during the acute phase of infection. The lowest concentration of SARS-CoV-2 viral copies this assay can detect is 138 copies/mL. A negative result  does not preclude SARS-Cov-2 infection and should not be used as the sole basis for treatment or other patient management decisions. A negative result may occur with  improper specimen collection/handling, submission of specimen other than nasopharyngeal swab, presence of viral mutation(s) within the areas targeted by this assay, and inadequate number of viral copies(<138 copies/mL). A negative result must be combined with clinical observations, patient history, and epidemiological information. The expected result is Negative.  Fact Sheet for Patients:  BloggerCourse.com  Fact Sheet for Healthcare Providers:  SeriousBroker.it  This test is no t yet approved or cleared by the [place] FDA and  has been authorized for detection and/or diagnosis of SARS-CoV-2 by FDA under an Emergency Use Authorization (EUA). This EUA will remain  in effect (meaning this test can be used) for the duration of the COVID-19 declaration under Section 564(b)(1) of the Act, 21 U.S.C.section 360bbb-3(b)(1), unless the authorization is terminated  or revoked sooner.       Influenza A by PCR NEGATIVE NEGATIVE   Influenza B by PCR NEGATIVE NEGATIVE    Comment: (NOTE) The Xpert Xpress SARS-CoV-2/FLU/RSV plus assay is intended as an aid in the diagnosis of influenza from Nasopharyngeal swab specimens and should not be used as a sole basis for treatment. Nasal washings and aspirates are unacceptable for Xpert Xpress SARS-CoV-2/FLU/RSV testing.  Fact Sheet for Patients: BloggerCourse.com  Fact Sheet for Healthcare Providers: SeriousBroker.it  This test is not yet approved or cleared by the [place] FDA and has been authorized for detection and/or diagnosis of SARS-CoV-2 by FDA under an Emergency Use Authorization (EUA). This EUA will remain in effect (meaning this test can be used) for the duration  of the COVID-19 declaration under Section 564(b)(1) of the Act, 21 U.S.C. section 360bbb-3(b)(1), unless the authorization is terminated or revoked.  Performed at [facility], [address] [place], [place] [address]     Blood Alcohol level:  Lab Results  Component Value Date   [facility] <11 01/31/2013    Metabolic Disorder Labs:  No results found for: HGBA1C, MPG No results found for: PROLACTIN No results found for: CHOL, TRIG, HDL, CHOLHDL, VLDL, LDLCALC  Current Medications: Current Facility-Administered Medications  Medication Dose Route Frequency Provider Last Rate Last Admin  . acetaminophen (TYLENOL) tablet 650 mg  650 mg Oral Q6H PRN Marline Morace E, NP      . alum & mag hydroxide-simeth (MAALOX/MYLANTA) 200-200-20 MG/5ML suspension 30 mL  30 mL Oral Q4H PRN Elaynah Virginia E, NP      . hydrOXYzine (ATARAX/VISTARIL) tablet 25 mg  25 mg Oral TID PRN Hadasa Gasner E, NP      . magnesium hydroxide (MILK  OF MAGNESIA) suspension 30 mL  30 mL Oral Daily PRN Genevra Orne E, NP      . traZODone (DESYREL) tablet 50 mg  50 mg Oral QHS PRN Petula Rotolo E, NP       PTA Medications: Medications Prior to Admission  Medication Sig Dispense Refill Last Dose  . Alum & Mag Hydroxide-Simeth (MAGIC MOUTHWASH W/LIDOCAINE) SOLN Take 5 mLs by mouth 3 (three) times daily as needed for mouth pain. 100 mL 0   . amphetamine-dextroamphetamine (ADDERALL XR) 20 MG 24 hr capsule Take 20 mg by mouth daily.     Marland Kitchen FLUoxetine (PROZAC) 20 MG capsule Take 20 mg by mouth daily.     Marland Kitchen HYDROcodone-acetaminophen (NORCO/VICODIN) 5-325 MG per tablet Take one tab po q 4-6 hrs prn pain 10 tablet 0   . HYDROcodone-homatropine (HYCODAN) 5-1.5 MG/5ML syrup Take 5 mLs by mouth every 6 (six) hours as needed for cough. 120 mL 0   . ibuprofen (ADVIL,MOTRIN) 800 MG tablet Take 1 tablet (800 mg total) by mouth every 8 (eight) hours as needed for pain. (Patient not taking: Reported on 09/27/2014) 90  tablet 5   . loratadine-pseudoephedrine (CLARITIN-D 12 HOUR) 5-120 MG per tablet Take 1 tablet by mouth 2 (two) times daily. 20 tablet 0   . loratadine-pseudoephedrine (CLARITIN-D 24-HOUR) 10-240 MG per 24 hr tablet Take 1 tablet by mouth daily.     . predniSONE (DELTASONE) 20 MG tablet Take 2 tablets (40 mg total) by mouth daily. For 5 days 10 tablet 0   . sertraline (ZOLOFT) 25 MG tablet Take 25 mg by mouth at bedtime.       Musculoskeletal: Strength & Muscle Tone: within normal limits Gait & Station: normal Patient leans: N/A  Psychiatric Specialty Exam:  Presentation  General Appearance: Appropriate for Environment  Eye Contact:Good  Speech:Clear and Coherent; Pressured  Speech Volume:Normal  Handedness:Right   Mood and Affect  Mood:Depressed; Hopeless; Worthless  Affect:Congruent   Thought Process  Thought Processes:Coherent  Duration of Psychotic Symptoms: No data recorded Past Diagnosis of Schizophrenia or Psychoactive disorder: No  Descriptions of Associations:Intact  Orientation:Full (Time, Place and Person)  Thought Content:Scattered; Tangential  Hallucinations:Hallucinations: None  Ideas of Reference:None  Suicidal Thoughts:Suicidal Thoughts: Yes, Active SI Active Intent and/or Plan: With Plan; With Intent  Homicidal Thoughts:Homicidal Thoughts: No   Sensorium  Memory:Immediate Good; Recent Good; Remote Good  Judgment:Fair  Insight:Fair   Executive Functions  Concentration:Fair  Attention Span:Fair  Recall:Fair  Fund of Knowledge:Fair  Language:Good   Psychomotor Activity  Psychomotor Activity:Psychomotor Activity: Normal   Assets  Assets:Communication Skills; Desire for Improvement; Social Support; Resilience; Transportation; Vocational/Educational; Physical Health; Housing; Financial Resources/Insurance   Sleep  Sleep:Sleep: Poor Number of Hours of Sleep: -1    Physical Exam: Physical Exam HENT:     Head:  Normocephalic.     Right Ear: External ear normal.     Left Ear: External ear normal.  Cardiovascular:     Rate and Rhythm: Normal rate.     Pulses: Normal pulses.  Pulmonary:     Effort: Pulmonary effort is normal.  Musculoskeletal:        General: Normal range of motion.     Cervical back: Normal range of motion.  Neurological:     General: No focal deficit present.     Mental Status: He is alert and oriented to person, place, and time.  Psychiatric:        Attention and Perception: Attention normal. He does not perceive  auditory or visual hallucinations.        Mood and Affect: Mood is anxious and depressed.        Speech: Speech normal.        Behavior: Behavior normal. Behavior is cooperative.        Thought Content: Thought content includes suicidal ideation. Thought content includes suicidal plan.        Cognition and Memory: Cognition normal.        Judgment: Judgment is impulsive.    Review of Systems  Constitutional: Negative.   HENT: Negative.   Eyes: Negative.   Respiratory: Negative.   Cardiovascular: Negative.   Gastrointestinal: Negative.   Musculoskeletal: Negative.   Skin: Negative.   Neurological: Negative.   Endo/Heme/Allergies: Negative.   Psychiatric/Behavioral: Positive for suicidal ideas. The patient is nervous/anxious and has insomnia.    Blood pressure 102/68, pulse 90, temperature 98.5 F (36.9 C), temperature source Oral, resp. rate 12, height 5\' 9"  (1.753 m), SpO2 99 %. There is no height or weight on file to calculate BMI.  Treatment Plan Summary: Daily contact with patient to assess and evaluate symptoms and progress in treatment and Medication management   Observation Level/Precautions:  15 minute checks  Laboratory:  CBC Chemistry Profile HbAIC UDS Lipid, TSH  Psychotherapy:  Group therapy  Medications:  prns  Consultations:  N/A  Discharge Concerns: Follow up with OPT  Estimated LOS: 2-4 days  Other:     Physician Treatment Plan  for Primary Diagnosis: <principal problem not specified> MDD severe without psychosis Long Term Goal(s): Improvement in symptoms so as ready for discharge  Short Term Goals: Ability to identify changes in lifestyle to reduce recurrence of condition will improve, Ability to verbalize feelings will improve, Ability to disclose and discuss suicidal ideas and Ability to identify triggers associated with substance abuse/mental health issues will improve  Physician Treatment Plan for Secondary Diagnosis: Active Problems:   MDD (major depressive disorder), recurrent severe, without psychosis (HCC)  Long Term Goal(s): Improvement in symptoms so as ready for discharge  Short Term Goals: Ability to verbalize feelings will improve, Ability to disclose and discuss suicidal ideas, Ability to demonstrate self-control will improve and Ability to identify and develop effective coping behaviors will improve  I certify that inpatient services furnished can reasonably be expected to improve the patient's condition.    , NP 6/5/20222:46 AM

## 2021-01-04 NOTE — Progress Notes (Signed)
   01/04/21 1300  Psych Admission Type (Psych Patients Only)  Admission Status Voluntary  Psychosocial Assessment  Patient Complaints Anxiety;Depression  Eye Contact Fair  Facial Expression Anxious  Affect Apprehensive;Appropriate to circumstance  Speech Logical/coherent  Interaction Assertive  Motor Activity Fidgety  Appearance/Hygiene Unremarkable  Behavior Characteristics Cooperative;Anxious  Mood Depressed;Anxious;Pleasant  Thought Process  Coherency WDL  Content WDL  Delusions None reported or observed  Perception WDL  Hallucination Auditory  Judgment Impaired  Confusion None  Danger to Self  Current suicidal ideation? Denies  Danger to Others  Danger to Others None reported or observed

## 2021-01-04 NOTE — Tx Team (Signed)
Initial Treatment Plan 01/04/2021 3:00 AM [name] BCW:[id number]    PATIENT STRESSORS: Marital or family conflict Occupational concerns   PATIENT STRENGTHS: Active sense of humor Average or above average intelligence Communication skills Motivation for treatment/growth   PATIENT IDENTIFIED PROBLEMS: Depression  Suicidal Ideation      "get my life together, quite smoking"             DISCHARGE CRITERIA:  Improved stabilization in mood, thinking, and/or behavior Motivation to continue treatment in a less acute level of care Need for constant or close observation no longer present Verbal commitment to aftercare and medication compliance  PRELIMINARY DISCHARGE PLAN: Outpatient therapy Return to previous living arrangement  PATIENT/FAMILY INVOLVEMENT: This treatment plan has been presented to and reviewed with the patient, Chad Williamson  The patient and family have been given the opportunity to ask questions and make suggestions.  Juliann Pares, RN 01/04/2021, 3:00 AM

## 2021-01-04 NOTE — H&P (Signed)
Psychiatric Admission Assessment Adult  Patient Identification: Chad Williamson MRN:  [id number] Date of Evaluation:  01/04/2021 Chief Complaint:  MDD (major depressive disorder), recurrent severe, without psychosis (HCC) [F33.2] Principal Diagnosis: <principal problem not specified> Diagnosis:  Active Problems:   MDD (major depressive disorder), recurrent severe, without psychosis (HCC)  History of Present Illness: Patient is seen and examined.  Patient is a 20 year old male who presented as a walk-in assessment to the behavioral health hospital on 01/04/2021.  The patient was accompanied by his mother.  She did not participate in the evaluation.  The patient reported that he had had depression since childhood, and his symptoms had worsened over the last 2 weeks.  He stated at that time his mental state was not good, and had numerous superficial self-inflicted lacerations on his left arm.  He admitted that he had been cutting himself since age 24, and he cut himself 2 days ago with scissors.  He reported suicidal ideation to walk into traffic.  He acknowledged that he had had suicide attempts as a child.  He had overdosed in 2014, but on my discussion with him today stated "it was only a couple of pills".  He stated that his most recent stressors was that he was living with his significant other and their child.  The child was just several months old.  He reported that he was angry, hostile and got into arguments easily.  It got to the point where the male asked him to leave the home.  He went out and spent the night in a car in front of the house, and was hoping that the conflict would be resolved, but the significant other would not allow him to return.  He then went to his mother's and stayed there for a couple of days, but "that did not work out either".  He then stated he went to stay with his grandmother, but he did not feel any better there and left.  He stated he was not working currently.  He stated  that as a child he was treated at the [facility] facility, and had been on medications, but did not believe that they were effective.  He stated that he had a family history of schizophrenia in his father, but denied any auditory or visual hallucinations.  He was admitted to the hospital for evaluation and stabilization.  Associated Signs/Symptoms: Depression Symptoms:  depressed mood, anhedonia, insomnia, psychomotor agitation, fatigue, feelings of worthlessness/guilt, difficulty concentrating, hopelessness, suicidal thoughts without plan, anxiety, loss of energy/fatigue, disturbed sleep, weight loss, Duration of Depression Symptoms: Greater than two weeks  (Hypo) Manic Symptoms:  Impulsivity, Irritable Mood, Labiality of Mood, Anxiety Symptoms:  Excessive Worry, Psychotic Symptoms:  denied PTSD Symptoms: Negative Total Time spent with patient: 30 minutes  Past Psychiatric History: Patient stated that he had attempted to overdose as a child and was hospitalized for 1 occasion on a pediatric unit.  Is the patient at risk to self? Yes.    Has the patient been a risk to self in the past 6 months? No.  Has the patient been a risk to self within the distant past? Yes.    Is the patient a risk to others? No.  Has the patient been a risk to others in the past 6 months? No.  Has the patient been a risk to others within the distant past? No.   Prior Inpatient Therapy:   Prior Outpatient Therapy:    Alcohol Screening: 1. How often do you  have a drink containing alcohol?: Never 2. How many drinks containing alcohol do you have on a typical day when you are drinking?: 1 or 2 3. How often do you have six or more drinks on one occasion?: Never AUDIT-C Score: 0 4. How often during the last year have you found that you were not able to stop drinking once you had started?: Never 5. How often during the last year have you failed to do what was normally expected from you because of  drinking?: Never 6. How often during the last year have you needed a first drink in the morning to get yourself going after a heavy drinking session?: Never 7. How often during the last year have you had a feeling of guilt of remorse after drinking?: Never 9. Have you or someone else been injured as a result of your drinking?: No 10. Has a relative or friend or a doctor or another health worker been concerned about your drinking or suggested you cut down?: No Alcohol Use Disorder Identification Test Final Score (AUDIT): 0 Substance Abuse History in the last 12 months: Yes {Consequences of Substance Abuse: Denied  Previous Psychotropic Medications: Yes.   Psychological Evaluations: Yes  Past Medical History:  Past Medical History:  Diagnosis Date  . Depression    History reviewed. No pertinent surgical history. Family History:  Family History  Problem Relation Age of Onset  . Asthma Father    Family Psychiatric  History: Reportedly father with schizophrenia Tobacco Screening: Have you used any form of tobacco in the last 30 days? (Cigarettes, Smokeless Tobacco, Cigars, and/or Pipes): Yes Tobacco use, Select all that apply: 5 or more cigarettes per day Are you interested in Tobacco Cessation Medications?: No, patient refused Counseled patient on smoking cessation including recognizing danger situations, developing coping skills and basic information about quitting provided: Refused/Declined practical counseling Social History:  Social History   Substance and Sexual Activity  Alcohol Use No     Social History   Substance and Sexual Activity  Drug Use Yes  . Types: Marijuana   Comment: last use 09/26/14    Additional Social History: Marital status: Single Are you sexually active?: Yes What is your sexual orientation?: heterosexual Has your sexual activity been affected by drugs, alcohol, medication, or emotional stress?: no Does patient have children?: Yes How many children?:  1 How is patient's relationship with their children?: daughter- still communicates with her    Pain Medications: Denies abuse Prescriptions: Denies abuse Over the Counter: Denies abuse History of alcohol / drug use?: Yes Longest period of sobriety (when/how long): Unknown Negative Consequences of Use:  (None) Withdrawal Symptoms:  (None) Name of Substance 1: Marijuana 1 - Age of First Use: 11 1 - Amount (size/oz): "One bong hit" 1 - Frequency: Daily 1 - Duration: Ongoing for years 1 - Last Use / Amount: 01/03/2021 1 - Method of Aquiring: Unknown 1- Route of Use: Smoking                  Allergies:   Allergies  Allergen Reactions  . Shellfish Allergy Anaphylaxis and Rash  . Latex Rash and Other (See Comments)    Redness   Lab Results:  Results for orders placed or performed during the hospital encounter of 01/04/21 (from the past 48 hour(s))  Resp Panel by RT-PCR (Flu A&B, Covid) Nasopharyngeal Swab     Status: None   Collection Time: 01/04/21 12:26 AM   Specimen: Nasopharyngeal Swab; Nasopharyngeal(NP) swabs in vial transport  medium  Result Value Ref Range   SARS Coronavirus 2 by RT PCR NEGATIVE NEGATIVE    Comment: (NOTE) SARS-CoV-2 target nucleic acids are NOT DETECTED.  The SARS-CoV-2 RNA is generally detectable in upper respiratory specimens during the acute phase of infection. The lowest concentration of SARS-CoV-2 viral copies this assay can detect is 138 copies/mL. A negative result does not preclude SARS-Cov-2 infection and should not be used as the sole basis for treatment or other patient management decisions. A negative result may occur with  improper specimen collection/handling, submission of specimen other than nasopharyngeal swab, presence of viral mutation(s) within the areas targeted by this assay, and inadequate number of viral copies(<138 copies/mL). A negative result must be combined with clinical observations, patient history, and  epidemiological information. The expected result is Negative.  Fact Sheet for Patients:  BloggerCourse.com  Fact Sheet for Healthcare Providers:  SeriousBroker.it  This test is no t yet approved or cleared by the [place] FDA and  has been authorized for detection and/or diagnosis of SARS-CoV-2 by FDA under an Emergency Use Authorization (EUA). This EUA will remain  in effect (meaning this test can be used) for the duration of the COVID-19 declaration under Section 564(b)(1) of the Act, 21 U.S.C.section 360bbb-3(b)(1), unless the authorization is terminated  or revoked sooner.       Influenza A by PCR NEGATIVE NEGATIVE   Influenza B by PCR NEGATIVE NEGATIVE    Comment: (NOTE) The Xpert Xpress SARS-CoV-2/FLU/RSV plus assay is intended as an aid in the diagnosis of influenza from Nasopharyngeal swab specimens and should not be used as a sole basis for treatment. Nasal washings and aspirates are unacceptable for Xpert Xpress SARS-CoV-2/FLU/RSV testing.  Fact Sheet for Patients: BloggerCourse.com  Fact Sheet for Healthcare Providers: SeriousBroker.it  This test is not yet approved or cleared by the [place] FDA and has been authorized for detection and/or diagnosis of SARS-CoV-2 by FDA under an Emergency Use Authorization (EUA). This EUA will remain in effect (meaning this test can be used) for the duration of the COVID-19 declaration under Section 564(b)(1) of the Act, 21 U.S.C. section 360bbb-3(b)(1), unless the authorization is terminated or revoked.  Performed at [facility], [address], [place], [place] [address]   Urine rapid drug screen (hosp performed)not at [facility]     Status: Abnormal   Collection Time: 01/04/21  3:03 AM  Result Value Ref Range   Opiates NONE DETECTED NONE DETECTED   Cocaine NONE DETECTED NONE DETECTED    Benzodiazepines NONE DETECTED NONE DETECTED   Amphetamines NONE DETECTED NONE DETECTED   Tetrahydrocannabinol POSITIVE (A) NONE DETECTED   Barbiturates NONE DETECTED NONE DETECTED    Comment: (NOTE) DRUG SCREEN FOR MEDICAL PURPOSES ONLY.  IF CONFIRMATION IS NEEDED FOR ANY PURPOSE, NOTIFY LAB WITHIN 5 DAYS.  LOWEST DETECTABLE LIMITS FOR URINE DRUG SCREEN Drug Class                     Cutoff (ng/mL) Amphetamine and metabolites    1000 Barbiturate and metabolites    200 Benzodiazepine                 200 Tricyclics and metabolites     300 Opiates and metabolites        300 Cocaine and metabolites        300 THC  50 Performed at [facility], [address], [place], [place] [address]   CBC     Status: None   Collection Time: 01/04/21  6:32 AM  Result Value Ref Range   WBC 7.7 4.0 - 10.5 K/uL   RBC 5.13 4.22 - 5.81 MIL/uL   Hemoglobin 15.3 13.0 - 17.0 g/dL   HCT 95.6 38.7 - 56.4 %   MCV 91.6 80.0 - 100.0 fL   MCH 29.8 26.0 - 34.0 pg   MCHC 32.6 30.0 - 36.0 g/dL   RDW 33.2 95.1 - 88.4 %   Platelets 217 150 - 400 K/uL   nRBC 0.0 0.0 - 0.2 %    Comment: Performed at [facility], [address], [place], [place] [address]  Comprehensive metabolic panel     Status: Abnormal   Collection Time: 01/04/21  6:32 AM  Result Value Ref Range   Sodium 141 135 - 145 mmol/L   Potassium 3.6 3.5 - 5.1 mmol/L   Chloride 101 98 - 111 mmol/L   CO2 34 (H) 22 - 32 mmol/L   Glucose, Bld 97 70 - 99 mg/dL    Comment: Glucose reference range applies only to samples taken after fasting for at least 8 hours.   BUN 19 6 - 20 mg/dL   Creatinine, Ser 3.01 0.61 - 1.24 mg/dL   Calcium 9.6 8.9 - 60.1 mg/dL   Total Protein 7.3 6.5 - 8.1 g/dL   Albumin 4.5 3.5 - 5.0 g/dL   AST 15 15 - 41 U/L   ALT 13 0 - 44 U/L   Alkaline Phosphatase 57 38 - 126 U/L   Total Bilirubin 1.7 (H) 0.3 - 1.2 mg/dL   GFR, Estimated >09 >32 mL/min    Comment:  (NOTE) Calculated using the CKD-EPI Creatinine Equation (2021)    Anion gap 6 5 - 15    Comment: Performed at [facility], [address] [place], [place] [address]  Lipid panel     Status: None   Collection Time: 01/04/21  6:32 AM  Result Value Ref Range   Cholesterol 128 0 - 200 mg/dL   Triglycerides 80 <220 mg/dL   HDL 44 >25 mg/dL   Total CHOL/HDL Ratio 2.9 RATIO   VLDL 16 0 - 40 mg/dL   LDL Cholesterol 68 0 - 99 mg/dL    Comment:        Total Cholesterol/HDL:CHD Risk Coronary Heart Disease Risk Table                     Men   Women  1/2 Average Risk   3.4   3.3  Average Risk       5.0   4.4  2 X Average Risk   9.6   7.1  3 X Average Risk  23.4   11.0        Use the calculated Patient Ratio above and the CHD Risk Table to determine the patient's CHD Risk.        ATP III CLASSIFICATION (LDL):  <100     mg/dL   Optimal  427-062  mg/dL   Near or Above                    Optimal  130-159  mg/dL   Borderline  376-283  mg/dL   High  >151     mg/dL   Very High Performed at [facility], [address] [place], [place] [address]   TSH     Status: None   Collection Time: 01/04/21  6:32 AM  Result Value Ref Range   TSH 0.595 0.350 - 4.500 uIU/mL    Comment: Performed by a 3rd Generation assay with a functional sensitivity of <=0.01 uIU/mL. Performed at [facility], [address] [place], [place] [address]   Ethanol     Status: None   Collection Time: 01/04/21  6:32 AM  Result Value Ref Range   Alcohol, Ethyl (B) <10 <10 mg/dL    Comment: (NOTE) Lowest detectable limit for serum alcohol is 10 mg/dL.  For medical purposes only. Performed at [facility], [address] [place], [place] [address]     Blood Alcohol level:  Lab Results  Component Value Date   [facility]   ETH <11 01/31/2013    Metabolic Disorder Labs:  No results found for: HGBA1C, MPG No results found for:  PROLACTIN Lab Results  Component Value Date   CHOL 128 01/04/2021   TRIG 80 01/04/2021   HDL 44 01/04/2021   CHOLHDL 2.9 01/04/2021   VLDL 16 01/04/2021   LDLCALC 68 01/04/2021    Current Medications: Current Facility-Administered Medications  Medication Dose Route Frequency Provider Last Rate Last Admin  . acetaminophen (TYLENOL) tablet 650 mg  650 mg Oral Q6H PRN Bobbitt, Shalon E, NP      . alum & mag hydroxide-simeth (MAALOX/MYLANTA) 200-200-20 MG/5ML suspension 30 mL  30 mL Oral Q4H PRN Bobbitt, Shalon E, NP      . citalopram (CELEXA) tablet 20 mg  20 mg Oral Daily Antonieta Pertlary, Mozell Hardacre Lawson, MD   20 mg at 01/04/21 1142  . hydrOXYzine (ATARAX/VISTARIL) tablet 25 mg  25 mg Oral TID PRN Bobbitt, Shalon E, NP      . magnesium hydroxide (MILK OF MAGNESIA) suspension 30 mL  30 mL Oral Daily PRN Bobbitt, Shalon E, NP      . nicotine polacrilex (NICORETTE) gum 2 mg  2 mg Oral PRN Antonieta Pertlary, Kasean Denherder Lawson, MD      . traZODone (DESYREL) tablet 50 mg  50 mg Oral QHS PRN Bobbitt, Shalon E, NP       PTA Medications: No medications prior to admission.    Musculoskeletal: Strength & Muscle Tone: within normal limits Gait & Station: normal Patient leans: N/A            Psychiatric Specialty Exam:  Presentation  General Appearance: Disheveled  Eye Contact:Fair  Speech:Normal Rate  Speech Volume:Decreased  Handedness:Right   Mood and Affect  Mood:Depressed  Affect:Congruent   Thought Process  Thought Processes:Coherent  Duration of Psychotic Symptoms: No data recorded Past Diagnosis of Schizophrenia or Psychoactive disorder: No  Descriptions of Associations:Circumstantial  Orientation:Full (Time, Place and Person)  Thought Content:Logical  Hallucinations:Hallucinations: None  Ideas of Reference:None  Suicidal Thoughts:Suicidal Thoughts: Yes, Passive SI Active Intent and/or Plan: Without Intent  Homicidal Thoughts:Homicidal Thoughts: No   Sensorium   Memory:Immediate Fair; Recent Fair; Remote Fair  Judgment:Fair  Insight:Lacking   Executive Functions  Concentration:Fair  Attention Span:Fair  Recall:Poor  Fund of Knowledge:Fair  Language:Fair   Psychomotor Activity  Psychomotor Activity:Psychomotor Activity: Decreased   Assets  Assets:Desire for Improvement; Resilience   Sleep  Sleep:Sleep: Poor Number of Hours of Sleep: -1    Physical Exam: Physical Exam Vitals and nursing note reviewed.  Constitutional:      Appearance: Normal appearance.  HENT:     Head: Normocephalic  and atraumatic.  Pulmonary:     Effort: Pulmonary effort is normal.  Neurological:     General: No focal deficit present.     Mental Status: He is alert and oriented to person, place, and time.    Review of Systems  All other systems reviewed and are negative.  Blood pressure 102/68, pulse 90, temperature 98.5 F (36.9 C), temperature source Oral, resp. rate 12, height 5\' 9"  (1.753 m), weight 58.5 kg, SpO2 99 %. Body mass index is 19.05 kg/m.  Treatment Plan Summary: Daily contact with patient to assess and evaluate symptoms and progress in treatment, Medication management and Plan : Patient is seen and examined.  Patient is a 20 year old male with the above-stated past psychiatric history who was admitted secondary to worsening depression and suicidal ideation.  He will be admitted to the hospital.  He will be integrated in the milieu.  He will be encouraged to attend groups.  We will start him on Celexa 20 mg p.o. daily.  He will also have available hydroxyzine for anxiety as well as trazodone for sleep.  Review of his admission laboratories revealed normal electrolytes including creatinine and liver function enzymes.  Lipid panel was normal.  CBC was normal.  TSH was 0.595.  Respiratory panel was negative for influenza A, B or coronavirus.  Blood alcohol was less than 10, drug screen was positive for marijuana.  His vital signs are  stable, he is afebrile.  Observation Level/Precautions:  15 minute checks  Laboratory:  Chemistry Profile  Psychotherapy:    Medications:    Consultations:    Discharge Concerns:    Estimated LOS:  Other:     Physician Treatment Plan for Primary Diagnosis: <principal problem not specified> Long Term Goal(s): Improvement in symptoms so as ready for discharge  Short Term Goals: Ability to identify changes in lifestyle to reduce recurrence of condition will improve, Ability to verbalize feelings will improve, Ability to disclose and discuss suicidal ideas, Ability to demonstrate self-control will improve, Ability to identify and develop effective coping behaviors will improve, Ability to maintain clinical measurements within normal limits will improve and Ability to identify triggers associated with substance abuse/mental health issues will improve  Physician Treatment Plan for Secondary Diagnosis: Active Problems:   MDD (major depressive disorder), recurrent severe, without psychosis (HCC)  Long Term Goal(s): Improvement in symptoms so as ready for discharge  Short Term Goals: Ability to identify changes in lifestyle to reduce recurrence of condition will improve, Ability to verbalize feelings will improve, Ability to disclose and discuss suicidal ideas, Ability to demonstrate self-control will improve, Ability to identify and develop effective coping behaviors will improve, Ability to maintain clinical measurements within normal limits will improve and Ability to identify triggers associated with substance abuse/mental health issues will improve  I certify that inpatient services furnished can reasonably be expected to improve the patient's condition.    21, MD 6/5/20222:22 PM

## 2021-01-04 NOTE — [facility] Group Notes (Signed)
Patient did not attend the goals group. 
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
## 2021-01-04 NOTE — Progress Notes (Signed)
EKG results placed on the outside of pt's shadow chart:   Sinus bradycardia Vent rate 59 Otherwise normal ECG QT/QTc 386/382

## 2021-01-04 NOTE — Progress Notes (Signed)
The patient would only share that he had a positive day. His goal for tomorrow is that he hopes that all of his peers get better.

## 2021-01-04 NOTE — Progress Notes (Signed)
   01/04/21 2316  COVID-19 Daily Checkoff  Have you had a fever (temp > 37.80C/100F)  in the past 24 hours?  No  If you have had runny nose, nasal congestion, sneezing in the past 24 hours, has it worsened? No  COVID-19 EXPOSURE  Have you traveled outside the state in the past 14 days? No  Have you been in contact with someone with a confirmed diagnosis of COVID-19 or PUI in the past 14 days without wearing appropriate PPE? No  Have you been living in the same home as a person with confirmed diagnosis of COVID-19 or a PUI (household contact)? No  Have you been diagnosed with COVID-19? No

## 2021-01-05 LAB — HEMOGLOBIN A1C
Hgb A1c MFr Bld: 5.5 % (ref 4.8–5.6)
Mean Plasma Glucose: 111 mg/dL

## 2021-01-05 MED ORDER — ADULT MULTIVITAMIN W/MINERALS CH
1.0000 | ORAL_TABLET | Freq: Every day | ORAL | Status: DC
  Administered 2021-01-05 – 2021-01-10 (×6): 1 via ORAL
  Filled 2021-01-05 (×8): qty 1

## 2021-01-05 MED ORDER — ENSURE ENLIVE PO LIQD
237.0000 mL | ORAL | Status: DC
  Administered 2021-01-05 – 2021-01-08 (×3): 237 mL via ORAL
  Filled 2021-01-05 (×7): qty 237

## 2021-01-05 MED ORDER — BOOST / RESOURCE BREEZE PO LIQD CUSTOM
1.0000 | ORAL | Status: DC
  Administered 2021-01-05 – 2021-01-10 (×5): 1 via ORAL
  Filled 2021-01-05 (×8): qty 1

## 2021-01-05 NOTE — Progress Notes (Signed)
Psychoeducational Group Note  Date:  01/05/2021 Time:  1705  Group Topic/Focus:  Personal Choices and Values:   The focus of this group is to help patients assess and explore the importance of values in their lives, how their values affect their decisions, how they express their values and what opposes their expression.  Participation Level: Did Not Attend  Participation Quality:  Not Applicable  Affect:  Not Applicable  Cognitive:  Not Applicable  Insight:  Not Applicable  Engagement in Group: Not Applicable  Additional Comments:  Pt said that he was not feeling well and as result could not attend group.  Chad Williamson E 01/05/2021, 7:15 PM

## 2021-01-05 NOTE — BHH Group Notes (Signed)
LCSW Group Therapy Note  01/05/2021  Type of Therapy and Topic:  Group Therapy - Healthy vs Unhealthy Coping Skills  Participation Level:  Active  Description of Group The focus of this group was to determine what unhealthy coping techniques typically are used by group members and what healthy coping techniques would be helpful in coping with various problems. Patients were guided in becoming aware of the differences between healthy and unhealthy coping techniques. Patients were asked to identify 2-3 healthy coping skills they would like to learn to use more effectively.  Therapeutic Goals 1. Patients learned that coping is what human beings do all day long to deal with various situations in their lives 2. Patients defined and discussed healthy vs unhealthy coping techniques 3. Patients identified their preferred coping techniques and identified whether these were healthy or unhealthy 4. Patients determined 2-3 healthy coping skills they would like to become more familiar with and use more often. 5. Patients provided support and ideas to each other   Summary of Patient Progress:  Pt attended group but became upset after another pt was rude when staff asked him to stop interrupting. CSW met with pt one-on-one and discussed his emotions and was able to dis-escalate his anxiety.    Therapeutic Modalities Cognitive Behavioral Therapy Motivational Interviewing  Chad Williamson 01/05/2021  2:54 PM

## 2021-01-05 NOTE — Progress Notes (Signed)
NUTRITION ASSESSMENT RD working remotely.  Pt identified as at risk on the Malnutrition Screen Tool  INTERVENTION: - will order Boost Breeze once/day, each supplement provides 250 kcal and 9 grams of protein. - will order Ensure Enlive once/day, each supplement provides 350 kcal and 20 grams of protein. - will order 1 tablet multivitamin with minerals/day.  [facility] staff to continue to encourage PO intakes of meals, snacks, and supplements.    NUTRITION DIAGNOSIS: Unintentional weight loss related to sub-optimal intake as evidenced by pt report.   Goal: Pt to meet >/= 90% of their estimated nutrition needs.  Monitor:  PO intake  Assessment:  Patient presented as a walk-in. He reported ongoing depression that has worsened in the past 2 weeks. At the time of presentation he had several superficial, self-inflicted lacerations on his L arm. Patient reported he has been cutting since he was 20 years old.   Weight yesterday was 129 lb and PTA the most recently documented weight was 160 lb at [facility] on 01/04/20. This indicates 31 lb weight loss (19% body weight) in the past 1 year.    20 y.o. male  Height: Ht Readings from Last 1 Encounters:  01/04/21 5\' 9"  (1.753 m)    Weight: Wt Readings from Last 1 Encounters:  01/04/21 58.5 kg    Weight Hx: Wt Readings from Last 10 Encounters:  01/04/21 58.5 kg  09/29/14 69.9 kg (76 %, Z= 0.72)*  09/27/14 69.9 kg (77 %, Z= 0.72)*  04/17/13 64.4 kg (81 %, Z= 0.86)*  03/27/13 64.4 kg (81 %, Z= 0.89)*  03/20/13 64.4 kg (81 %, Z= 0.89)*  01/30/13 64.4 kg (83 %, Z= 0.95)*   * Growth percentiles are based on CDC (Boys, 2-20 Years) data.    BMI:  Body mass index is 19.05 kg/m. Pt meets criteria for normal weight based on current BMI.  Estimated Nutritional Needs: Kcal: 25-30 kcal/kg Protein: > 1 gram protein/kg Fluid: 1 ml/kcal  Diet Order:  Diet Order    None     Pt is also offered choice of unit snacks mid-morning and mid-afternoon.   Pt is eating as desired.   Lab results and medications reviewed.      04/02/13, MS, RD, LDN, CNSC Inpatient Clinical Dietitian RD pager # available in [facility]  After hours/weekend pager # available in [facility]

## 2021-01-05 NOTE — Progress Notes (Signed)
Recreation Therapy Notes  Date:  6.6.22 Time: 0930 Location: 300 Hall Dayroom  Group Topic: Stress Management  Goal Area(s) Addresses:  Patient will identify positive stress management techniques. Patient will identify benefits of using stress management post d/c.  Intervention: Stress Management  Activity: Meditation.  LRT played a meditation that focused on setting boundaries and that it's ok to say no for your own self care even if it's not what others want.    Education:  Stress Management, Discharge Planning.   Education Outcome: Acknowledges Education  Clinical Observations/Feedback: Pt did not attend group session.     Margarit Minshall, LRT/CTRS         Chad Williamson A 01/05/2021 12:30 PM 

## 2021-01-05 NOTE — Progress Notes (Signed)
Psychoeducational Group Note  Date:  01/05/2021 Time:  1105  Group Topic/Focus:  Goals Group:   The focus of this group is to help patients establish daily goals to achieve during treatment and discuss how the patient can incorporate goal setting into their daily lives to aide in recovery.  Participation Level: Did Not Attend  Participation Quality:  Not Applicable  Affect:  Not Applicable  Cognitive:  Not Applicable  Insight:  Not Applicable  Engagement in Group: Not Applicable  Additional Comments:  Pt did not attend group this morning.  [name] 01/05/2021, 11:03 AM

## 2021-01-05 NOTE — Tx Team (Signed)
Interdisciplinary Treatment and Diagnostic Plan Update  01/05/2021 Time of Session: 9:40am Chad Williamson MRN: [id number]  Principal Diagnosis: <principal problem not specified>  Secondary Diagnoses: Active Problems:   MDD (major depressive disorder), recurrent severe, without psychosis ([place])   Current Medications:  Current Facility-Administered Medications  Medication Dose Route Frequency Provider Last Rate Last Admin  . acetaminophen (TYLENOL) tablet 650 mg  650 mg Oral Q6H PRN Bobbitt, Shalon E, NP      . alum & mag hydroxide-simeth (MAALOX/MYLANTA) 200-200-20 MG/5ML suspension 30 mL  30 mL Oral Q4H PRN Bobbitt, Shalon E, NP      . citalopram (CELEXA) tablet 20 mg  20 mg Oral Daily Sharma Covert, MD   20 mg at 01/05/21 0825  . feeding supplement (BOOST / RESOURCE BREEZE) liquid 1 Container  1 Container Oral Q24H Sharma Covert, MD      . feeding supplement (ENSURE ENLIVE / ENSURE PLUS) liquid 237 mL  237 mL Oral Q24H Sharma Covert, MD      . hydrOXYzine (ATARAX/VISTARIL) tablet 25 mg  25 mg Oral TID PRN Bobbitt, Shalon E, NP   25 mg at 01/04/21 2116  . multivitamin with minerals tablet 1 tablet  1 tablet Oral Daily Sharma Covert, MD      . nicotine polacrilex (NICORETTE) gum 2 mg  2 mg Oral PRN Sharma Covert, MD      . traZODone (DESYREL) tablet 25 mg  25 mg Oral QHS PRN Bobbitt, Shalon E, NP       PTA Medications: No medications prior to admission.    Patient Stressors: Marital or family conflict Occupational concerns  Patient Strengths: Active sense of humor Average or above average intelligence Communication skills Motivation for treatment/growth  Treatment Modalities: Medication Management, Group therapy, Case management,  1 to 1 session with clinician, Psychoeducation, Recreational therapy.   Physician Treatment Plan for Primary Diagnosis: <principal problem not specified> Long Term Goal(s): Improvement in symptoms so as ready for  discharge Improvement in symptoms so as ready for discharge   Short Term Goals: Ability to identify changes in lifestyle to reduce recurrence of condition will improve Ability to verbalize feelings will improve Ability to disclose and discuss suicidal ideas Ability to demonstrate self-control will improve Ability to identify and develop effective coping behaviors will improve Ability to maintain clinical measurements within normal limits will improve Ability to identify triggers associated with substance abuse/mental health issues will improve Ability to identify changes in lifestyle to reduce recurrence of condition will improve Ability to verbalize feelings will improve Ability to disclose and discuss suicidal ideas Ability to demonstrate self-control will improve Ability to identify and develop effective coping behaviors will improve Ability to maintain clinical measurements within normal limits will improve Ability to identify triggers associated with substance abuse/mental health issues will improve  Medication Management: Evaluate patient's response, side effects, and tolerance of medication regimen.  Therapeutic Interventions: 1 to 1 sessions, Unit Group sessions and Medication administration.  Evaluation of Outcomes: Not Met  Physician Treatment Plan for Secondary Diagnosis: Active Problems:   MDD (major depressive disorder), recurrent severe, without psychosis ([place])  Long Term Goal(s): Improvement in symptoms so as ready for discharge Improvement in symptoms so as ready for discharge   Short Term Goals: Ability to identify changes in lifestyle to reduce recurrence of condition will improve Ability to verbalize feelings will improve Ability to disclose and discuss suicidal ideas Ability to demonstrate self-control will improve Ability to identify and develop effective  coping behaviors will improve Ability to maintain clinical measurements within normal limits will  improve Ability to identify triggers associated with substance abuse/mental health issues will improve Ability to identify changes in lifestyle to reduce recurrence of condition will improve Ability to verbalize feelings will improve Ability to disclose and discuss suicidal ideas Ability to demonstrate self-control will improve Ability to identify and develop effective coping behaviors will improve Ability to maintain clinical measurements within normal limits will improve Ability to identify triggers associated with substance abuse/mental health issues will improve     Medication Management: Evaluate patient's response, side effects, and tolerance of medication regimen.  Therapeutic Interventions: 1 to 1 sessions, Unit Group sessions and Medication administration.  Evaluation of Outcomes: Not Met   RN Treatment Plan for Primary Diagnosis: <principal problem not specified> Long Term Goal(s): Knowledge of disease and therapeutic regimen to maintain health will improve  Short Term Goals: Ability to remain free from injury will improve, Ability to verbalize frustration and anger appropriately will improve, Ability to demonstrate self-control, Ability to identify and develop effective coping behaviors will improve and Compliance with prescribed medications will improve  Medication Management: RN will administer medications as ordered by provider, will assess and evaluate patient's response and provide education to patient for prescribed medication. RN will report any adverse and/or side effects to prescribing provider.  Therapeutic Interventions: 1 on 1 counseling sessions, Psychoeducation, Medication administration, Evaluate responses to treatment, Monitor vital signs and CBGs as ordered, Perform/monitor CIWA, COWS, AIMS and Fall Risk screenings as ordered, Perform wound care treatments as ordered.  Evaluation of Outcomes: Not Met   LCSW Treatment Plan for Primary Diagnosis: <principal  problem not specified> Long Term Goal(s): Safe transition to appropriate next level of care at discharge, Engage patient in therapeutic group addressing interpersonal concerns.  Short Term Goals: Engage patient in aftercare planning with referrals and resources, Increase social support, Increase ability to appropriately verbalize feelings, Identify triggers associated with mental health/substance abuse issues and Increase skills for wellness and recovery  Therapeutic Interventions: Assess for all discharge needs, 1 to 1 time with Social worker, Explore available resources and support systems, Assess for adequacy in community support network, Educate family and significant other(s) on suicide prevention, Complete Psychosocial Assessment, Interpersonal group therapy.  Evaluation of Outcomes: Not Met   Progress in Treatment: Attending groups: Yes. Participating in groups: Yes. Taking medication as prescribed: Yes. Toleration medication: Yes. Family/Significant other contact made: No, will contact:  mother Patient understands diagnosis: Yes. Discussing patient identified problems/goals with staff: Yes. Medical problems stabilized or resolved: Yes. Denies suicidal/homicidal ideation: Yes. Issues/concerns per patient self-inventory: No.   New problem(s) identified: No, Describe:  none  New Short Term/Long Term Goal(s): medication stabilization, elimination of SI thoughts, development of comprehensive mental wellness plan.   Patient Goals:  Did not attend  Discharge Plan or Barriers: Patient is to be referred to [facility] for medication management and therapy.  Reason for Continuation of Hospitalization: Depression Medication stabilization Suicidal ideation  Estimated Length of Stay: 3-5 days  Attendees: Patient: Did not attend 01/05/2021   Physician: Ethelene Browns, MD 01/05/2021  Nursing:  01/05/2021   RN Care Manager: 01/05/2021   Social Worker: Darletta Moll, LCSW 01/05/2021   Recreational Therapist:  01/05/2021  Other:  01/05/2021  Other:  01/05/2021   Other: 01/05/2021       Scribe for Treatment Team: Vassie Moselle, LCSW 01/05/2021 10:45 AM

## 2021-01-05 NOTE — Progress Notes (Signed)
   01/05/21 1202  Vital Signs  Temp 98.1 F (36.7 C)  Temp Source Oral  Pulse Rate (!) 58  BP 120/84  BP Location Right Arm  BP Method Automatic  Patient Position (if appropriate) Sitting   D: Patient denies SI/HI/AVH. Patient denies anxiety and depression. Pt. Reports that he has problems eating; that he feels nausea. Pt. Was seen eating snacks. Pt. Came back from the cafeteria for lunch because he couldn't be in there. Pt. Asks for ensure. A:  Patient took scheduled medicine.  Support and encouragement provided Routine safety checks conducted every 15 minutes. Patient  Informed to notify staff with any concerns.   R:  Safety maintained.

## 2021-01-05 NOTE — Progress Notes (Addendum)
[facility] MD Progress Note  01/05/2021 4:08 PM Chad Williamson  MRN:  [id number]   Reason for admission:  Chad Williamson is a 20 year old male with prior history of depression, history of prior suicide attempts and history of nonsuicidal self-injurious behavior admitted secondary to worsening depressive symptoms and suicidal ideation with thoughts of walking into traffic.  Objective: Medical record reviewed.  Patient's case discussed in detail with members of the treatment team.  I met with and evaluated the patient on the unit for follow-up today.  Patient states that he was admitted to the hospital for "help with my mental issues."  When asked to describe his mental issues he states that he is "trying to learn to forgive myself and I want to be able to keep a job.  I lost my job due to my emotions."  The patient describes his mood as "sad and self-hatred."  He denies active suicidal ideation intent or plan but reports passive wishes not to be alive.  Patient states that if he were dead he would not have to feel his emotions.  He denies thoughts of harming himself in the hospital.  He reports that he slept okay last night.  He continues to have trouble eating but is unable to state why.  He simply states "I cannot eat and if I do I throw it up."  He denies concerns about his weight, low appetite or nausea.  He reports that he is able to eat the fruit cups and to drink Ensure and other beverages with calories.  I encouraged him to consume calories in food or fluids where he can.  Patient denies AI, HI, AH, VH or PI.  He reports feeling calmer and less irritable since starting Celexa.  He denies any medication side effects.  He denies any other physical problems.  The patient slept 6.75 hours last night.  Vital signs this morning include BP of 119/84 sitting and 119/86 standing, pulse of 49 sitting and 82 standing and temperature of 98.2.  Repeat vital signs at noon included BP of 120/84, pulse of 58 and temperature of  98.1.  Labs yesterday morning include CMP with CO2 of 34, total bili of 1.7 and otherwise WNL.  Lipid profile was WNL.  CBC was WNL.  Hemoglobin A1c was 5.5.  TSH was 0.595.  Urine drug screen was positive for THC.  EKG on 01/04/2021 revealed sinus bradycardia with sinus arrhythmia, ventricular rate of 57 and QT/QTc of 392/381.  Nursing notes document that patient reported experiencing auditory hallucinations last night that he described as "noise."  He did not attend morning groups today but attended some later in the day.  Principal Problem: MDD (major depressive disorder), recurrent severe, without psychosis (Chad Williamson) Diagnosis: Principal Problem:   MDD (major depressive disorder), recurrent severe, without psychosis ([name])  Total Time spent with patient: 20 minutes  Past Psychiatric History: See admission H&P  Past Medical History:  Past Medical History:  Diagnosis Date  . Depression    History reviewed. No pertinent surgical history. Family History:  Family History  Problem Relation Age of Onset  . Asthma Father    Family Psychiatric  History: See admission H&P Social History:  Social History   Substance and Sexual Activity  Alcohol Use No     Social History   Substance and Sexual Activity  Drug Use Yes  . Types: Marijuana   Comment: last use 09/26/14    Social History   Socioeconomic History  . Marital status:  Single    Spouse name: Not on file  . Number of children: Not on file  . Years of education: Not on file  . Highest education level: Not on file  Occupational History  . Not on file  Tobacco Use  . Smoking status: Never Smoker  . Smokeless tobacco: Never Used  Vaping Use  . Vaping Use: Every day  Substance and Sexual Activity  . Alcohol use: No  . Drug use: Yes    Types: Marijuana    Comment: last use 09/26/14  . Sexual activity: Not on file  Other Topics Concern  . Not on file  Social History Narrative  . Not on file   Social Determinants of Health    Financial Resource Strain: Not on file  Food Insecurity: Not on file  Transportation Needs: Not on file  Physical Activity: Not on file  Stress: Not on file  Social Connections: Not on file   Additional Social History:    Pain Medications: Denies abuse Prescriptions: Denies abuse Over the Counter: Denies abuse History of alcohol / drug use?: Yes Longest period of sobriety (when/how long): Unknown Negative Consequences of Use:  (None) Withdrawal Symptoms:  (None) Name of Substance 1: Marijuana 1 - Age of First Use: 11 1 - Amount (size/oz): "One bong hit" 1 - Frequency: Daily 1 - Duration: Ongoing for years 1 - Last Use / Amount: 01/03/2021 1 - Method of Aquiring: Unknown 1- Route of Use: Smoking                  Sleep: Good  Appetite:  Poor  Current Medications: Current Facility-Administered Medications  Medication Dose Route Frequency Provider Last Rate Last Admin  . acetaminophen (TYLENOL) tablet 650 mg  650 mg Oral Q6H PRN Bobbitt, Shalon E, NP      . alum & mag hydroxide-simeth (MAALOX/MYLANTA) 200-200-20 MG/5ML suspension 30 mL  30 mL Oral Q4H PRN Bobbitt, Shalon E, NP      . citalopram (CELEXA) tablet 20 mg  20 mg Oral Daily Sharma Covert, MD   20 mg at 01/05/21 0825  . feeding supplement (BOOST / RESOURCE BREEZE) liquid 1 Container  1 Container Oral Q24H Sharma Covert, MD   1 Container at 01/05/21 1146  . feeding supplement (ENSURE ENLIVE / ENSURE PLUS) liquid 237 mL  237 mL Oral Q24H Sharma Covert, MD      . hydrOXYzine (ATARAX/VISTARIL) tablet 25 mg  25 mg Oral TID PRN Bobbitt, Shalon E, NP   25 mg at 01/04/21 2116  . multivitamin with minerals tablet 1 tablet  1 tablet Oral Daily Sharma Covert, MD   1 tablet at 01/05/21 1151  . nicotine polacrilex (NICORETTE) gum 2 mg  2 mg Oral PRN Sharma Covert, MD      . traZODone (DESYREL) tablet 25 mg  25 mg Oral QHS PRN Bobbitt, Shalon E, NP        Lab Results:  Results for orders placed  or performed during the hospital encounter of 01/04/21 (from the past 48 hour(s))  Resp Panel by RT-PCR (Flu A&B, Covid) Nasopharyngeal Swab     Status: None   Collection Time: 01/04/21 12:26 AM   Specimen: Nasopharyngeal Swab; Nasopharyngeal(NP) swabs in vial transport medium  Result Value Ref Range   SARS Coronavirus 2 by RT PCR NEGATIVE NEGATIVE    Comment: (NOTE) SARS-CoV-2 target nucleic acids are NOT DETECTED.  The SARS-CoV-2 RNA is generally detectable in upper respiratory specimens during  the acute phase of infection. The lowest concentration of SARS-CoV-2 viral copies this assay can detect is 138 copies/mL. A negative result does not preclude SARS-Cov-2 infection and should not be used as the sole basis for treatment or other patient management decisions. A negative result may occur with  improper specimen collection/handling, submission of specimen other than nasopharyngeal swab, presence of viral mutation(s) within the areas targeted by this assay, and inadequate number of viral copies(<138 copies/mL). A negative result must be combined with clinical observations, patient history, and epidemiological information. The expected result is Negative.  Fact Sheet for Patients:  EntrepreneurPulse.com.au  Fact Sheet for Healthcare Providers:  IncredibleEmployment.be  This test is no t yet approved or cleared by the [place] FDA and  has been authorized for detection and/or diagnosis of SARS-CoV-2 by FDA under an Emergency Use Authorization (EUA). This EUA will remain  in effect (meaning this test can be used) for the duration of the COVID-19 declaration under Section 564(b)(1) of the Act, 21 U.S.C.section 360bbb-3(b)(1), unless the authorization is terminated  or revoked sooner.       Influenza A by PCR NEGATIVE NEGATIVE   Influenza B by PCR NEGATIVE NEGATIVE    Comment: (NOTE) The Xpert Xpress SARS-CoV-2/FLU/RSV plus assay is  intended as an aid in the diagnosis of influenza from Nasopharyngeal swab specimens and should not be used as a sole basis for treatment. Nasal washings and aspirates are unacceptable for Xpert Xpress SARS-CoV-2/FLU/RSV testing.  Fact Sheet for Patients: EntrepreneurPulse.com.au  Fact Sheet for Healthcare Providers: IncredibleEmployment.be  This test is not yet approved or cleared by the [place] FDA and has been authorized for detection and/or diagnosis of SARS-CoV-2 by FDA under an Emergency Use Authorization (EUA). This EUA will remain in effect (meaning this test can be used) for the duration of the COVID-19 declaration under Section 564(b)(1) of the Act, 21 U.S.C. section 360bbb-3(b)(1), unless the authorization is terminated or revoked.  Performed at [facility], [place] [address] [place], [place] [address]   Urine rapid drug screen (hosp performed)not at [facility]     Status: Abnormal   Collection Time: 01/04/21  3:03 AM  Result Value Ref Range   Opiates NONE DETECTED NONE DETECTED   Cocaine NONE DETECTED NONE DETECTED   Benzodiazepines NONE DETECTED NONE DETECTED   Amphetamines NONE DETECTED NONE DETECTED   Tetrahydrocannabinol POSITIVE (A) NONE DETECTED   Barbiturates NONE DETECTED NONE DETECTED    Comment: (NOTE) DRUG SCREEN FOR MEDICAL PURPOSES ONLY.  IF CONFIRMATION IS NEEDED FOR ANY PURPOSE, NOTIFY LAB WITHIN 5 DAYS.  LOWEST DETECTABLE LIMITS FOR URINE DRUG SCREEN Drug Class                     Cutoff (ng/mL) Amphetamine and metabolites    1000 Barbiturate and metabolites    200 Benzodiazepine                 353 Tricyclics and metabolites     300 Opiates and metabolites        300 Cocaine and metabolites        300 THC                            50 Performed at [facility], [place] [address] [place], [place] [address]   CBC     Status: None   Collection Time: 01/04/21  6:32 AM   Result Value Ref  Range   WBC 7.7 4.0 - 10.5 K/uL   RBC 5.13 4.22 - 5.81 MIL/uL   Hemoglobin 15.3 13.0 - 17.0 g/dL   HCT 47.0 39.0 - 52.0 %   MCV 91.6 80.0 - 100.0 fL   MCH 29.8 26.0 - 34.0 pg   MCHC 32.6 30.0 - 36.0 g/dL   RDW 12.4 11.5 - 15.5 %   Platelets 217 150 - 400 K/uL   nRBC 0.0 0.0 - 0.2 %    Comment: Performed at [facility], [place] [address], [place], [place] [address]  Comprehensive metabolic panel     Status: Abnormal   Collection Time: 01/04/21  6:32 AM  Result Value Ref Range   Sodium 141 135 - 145 mmol/L   Potassium 3.6 3.5 - 5.1 mmol/L   Chloride 101 98 - 111 mmol/L   CO2 34 (H) 22 - 32 mmol/L   Glucose, Bld 97 70 - 99 mg/dL    Comment: Glucose reference range applies only to samples taken after fasting for at least 8 hours.   BUN 19 6 - 20 mg/dL   Creatinine, Ser 0.98 0.61 - 1.24 mg/dL   Calcium 9.6 8.9 - 10.3 mg/dL   Total Protein 7.3 6.5 - 8.1 g/dL   Albumin 4.5 3.5 - 5.0 g/dL   AST 15 15 - 41 U/L   ALT 13 0 - 44 U/L   Alkaline Phosphatase 57 38 - 126 U/L   Total Bilirubin 1.7 (H) 0.3 - 1.2 mg/dL   GFR, Estimated >60 >60 mL/min    Comment: (NOTE) Calculated using the CKD-EPI Creatinine Equation (2021)    Anion gap 6 5 - 15    Comment: Performed at [facility], [place] [address] [place],  [address]  Hemoglobin A1c     Status: None   Collection Time: 01/04/21  6:32 AM  Result Value Ref Range   Hgb A1c MFr Bld 5.5 4.8 - 5.6 %    Comment: (NOTE)         Prediabetes: 5.7 - 6.4         Diabetes: >6.4         Glycemic control for adults with diabetes: <7.0    Mean Plasma Glucose 111 mg/dL    Comment: (NOTE) Performed At: [facility] [place], [place] [id number] Rush Farmer MD OV:[id number]   Lipid panel     Status: None   Collection Time: 01/04/21  6:32 AM  Result Value Ref Range   Cholesterol 128 0 - 200 mg/dL   Triglycerides 80 <150 mg/dL   HDL 44 >40 mg/dL   Total CHOL/HDL  Ratio 2.9 RATIO   VLDL 16 0 - 40 mg/dL   LDL Cholesterol 68 0 - 99 mg/dL    Comment:        Total Cholesterol/HDL:CHD Risk Coronary Heart Disease Risk Table                     Men   Women  1/2 Average Risk   3.4   3.3  Average Risk       5.0   4.4  2 X Average Risk   9.6   7.1  3 X Average Risk  23.4   11.0        Use the calculated Patient Ratio above and the CHD Risk Table to determine the patient's CHD Risk.        ATP III CLASSIFICATION (LDL):  <100  mg/dL   Optimal  100-129  mg/dL   Near or Above                    Optimal  130-159  mg/dL   Borderline  160-189  mg/dL   High  >190     mg/dL   Very High Performed at [place] [address] [place], [place] [address]   TSH     Status: None   Collection Time: 01/04/21  6:32 AM  Result Value Ref Range   TSH 0.595 0.350 - 4.500 uIU/mL    Comment: Performed by a 3rd Generation assay with a functional sensitivity of <=0.01 uIU/mL. Performed at [facility], [place] [address] [place], [place] [address]   Ethanol     Status: None   Collection Time: 01/04/21  6:32 AM  Result Value Ref Range   Alcohol, Ethyl (B) <10 <10 mg/dL    Comment: (NOTE) Lowest detectable limit for serum alcohol is 10 mg/dL.  For medical purposes only. Performed at [facility], [place] [address], [place], [place] [address]     Blood Alcohol level:  Lab Results  Component Value Date   [facility] <10 01/04/2021   ETH <11 97/41/6384    Metabolic Disorder Labs: Lab Results  Component Value Date   HGBA1C 5.5 01/04/2021   MPG 111 01/04/2021   No results found for: PROLACTIN Lab Results  Component Value Date   CHOL 128 01/04/2021   TRIG 80 01/04/2021   HDL 44 01/04/2021   CHOLHDL 2.9 01/04/2021   VLDL 16 01/04/2021   LDLCALC 68 01/04/2021    Physical Findings: AIMS: Facial and Oral Movements Muscles of Facial Expression: None, normal Lips and Perioral Area: None, normal Jaw:  None, normal Tongue: None, normal,Extremity Movements Upper (arms, wrists, hands, fingers): None, normal Lower (legs, knees, ankles, toes): None, normal, Trunk Movements Neck, shoulders, hips: None, normal, Overall Severity Severity of abnormal movements (highest score from questions above): None, normal Incapacitation due to abnormal movements: None, normal Patient's awareness of abnormal movements (rate only patient's report): No Awareness, Dental Status Current problems with teeth and/or dentures?: No Does patient usually wear dentures?: No  CIWA:    COWS:     Musculoskeletal: Strength & Muscle Tone: within normal limits Gait & Station: normal Patient leans: N/A  Psychiatric Specialty Exam:  Presentation  General Appearance: Casual  Eye Contact:Fair  Speech:Normal Rate  Speech Volume:Normal  Handedness:Right   Mood and Affect  Mood:Depressed  Affect:Full Range   Thought Process  Thought Processes:Coherent  Descriptions of Associations:Circumstantial  Orientation:Full (Time, Place and Person)  Thought Content:Logical  History of Schizophrenia/Schizoaffective disorder:No  Duration of Psychotic Symptoms:No data recorded Hallucinations:Hallucinations: None  Ideas of Reference:None  Suicidal Thoughts:Suicidal Thoughts: Yes, Passive SI Active Intent and/or Plan: Without Intent  Homicidal Thoughts:Homicidal Thoughts: No   Sensorium  Memory:Immediate Fair; Recent Fair; Remote Fair  Judgment:Fair  Insight:Lacking   Executive Functions  Concentration:Fair  Attention Span:Fair  [place]   Psychomotor Activity  Psychomotor Activity:Psychomotor Activity: Normal   Assets  Assets:Desire for Improvement; Resilience   Sleep  Sleep:Sleep: Good Number of Hours of Sleep: 6.75    Physical Exam: Physical Exam Vitals and nursing note reviewed.  Constitutional:      General: He is not in acute  distress. HENT:     Head: Normocephalic and atraumatic.  Pulmonary:     Effort: Pulmonary effort is normal.  Neurological:  General: No focal deficit present.     Mental Status: He is alert and oriented to person, place, and time.    Review of Systems  Constitutional: Negative.   Respiratory: Negative.   Cardiovascular: Negative.   Gastrointestinal: Negative.   Neurological: Negative.   Psychiatric/Behavioral: Positive for depression and suicidal ideas. Negative for hallucinations. The patient does not have insomnia.    Blood pressure 120/84, pulse (!) 58, temperature 98.1 F (36.7 C), temperature source Oral, resp. rate 12, height 5' 9"  (1.753 m), weight 58.5 kg, SpO2 99 %. Body mass index is 19.05 kg/m.   Treatment Plan Summary: Daily contact with patient to assess and evaluate symptoms and progress in treatment and Medication management   Continue every 15-minute observation status.  Encourage participation in group therapy and therapeutic milieu.  Depression -Continue Celexa 20 mg daily  Anxiety -Continue Celexa 20 mg daily -Continue hydroxyzine 33m TID PRN  Insomnia -Continue trazodone 25 mg at bedtime PRN  Low body weight -Continue Boost and Ensure supplements  Disposition planning in progress   MArthor Captain MD 01/05/2021, 4:08 PM

## 2021-01-05 NOTE — [facility] Group Notes (Signed)
Occupational Therapy Group Note Date: 01/05/2021 Group Topic/Focus: Stress Management  Group Description: Group encouraged increased participation and engagement through discussion focused on topic of stress management. Patients engaged interactively to discuss components of stress including physical signs, emotional signs, negative management strategies, and positive management strategies. Each individual identified one new stress management strategy they would like to try moving forward.    Therapeutic Goals: Identify current stressors Identify healthy vs unhealthy stress management strategies/techniques Discuss and identify physical and emotional signs of stress  Participation Level: Active   Participation Quality: Independent   Behavior: Calm, Cooperative and Interactive   Speech/Thought Process: Focused   Affect/Mood: Full range   Insight: Fair   Judgement: Fair   Individualization: [place] was active in their participation of group discussion/activity and shared his frustrations surrounding current hospitalization. Receptive to stress management strategies offered during discussion.   Modes of Intervention: Discussion, Education and Support  Patient Response to Interventions:  Attentive, Engaged and Receptive   Plan: Continue to engage patient in OT groups 2 - 3x/week.  01/05/2021  Carlyon Nolasco, MOT, OTR/L 

## 2021-01-06 MED ORDER — QUETIAPINE FUMARATE 50 MG PO TABS
50.0000 mg | ORAL_TABLET | Freq: Every day | ORAL | Status: DC
  Administered 2021-01-06: 50 mg via ORAL
  Filled 2021-01-06 (×3): qty 1

## 2021-01-06 NOTE — Progress Notes (Signed)
Recreation Therapy Notes  Animal-Assisted Activity (AAA) Program Checklist/Progress Notes Patient Eligibility Criteria Checklist & Daily Group note for Rec Tx Intervention  Date: 6.7.22 Time: 1430 Location: [address]   AAA/T Program Assumption of Risk Form signed by Engineer, production or Parent Legal Guardian  YES   Patient is free of allergies or severe asthma  YES  Patient reports no fear of animals  YES  Patient reports no history of cruelty to animals YES   Patient understands his/her participation is voluntary  YES   Patient washes hands before animal contact  YES   Patient washes hands after animal contact YES  Behavioral Response: Engaged  Education: Charity fundraiser, Appropriate Animal Interaction   Education Outcome: Acknowledges understanding/In group clarification offered/Needs additional education.   Clinical Observations/Feedback: Pt attended and participated in activity.    Caroll Rancher, LRT/CTRS         Caroll Rancher A 01/06/2021 3:25 PM

## 2021-01-06 NOTE — Progress Notes (Signed)
Patient rated his day as a 7 out of 10. Patient states that he has been working on himself. He did not share a goal for tomorrow.

## 2021-01-06 NOTE — [facility] Group Notes (Signed)
[facility] Group Notes:  (Nursing/MHT/Case Management/Adjunct)  Date:  01/06/2021  Time:  9:31 AM  Type of Therapy:  Goals group  Participation Level:  Active  Participation Quality:  Appropriate and Sharing  Affect:  Depressed  Cognitive:  Alert  Insight:  Improving  Engagement in Group:  Engaged  Modes of Intervention:  Discussion and Education  Summary of Progress/Problems:  Chad Williamson stated he slept well with sleeping pill last night.  His goal for today "I want my day to get better."  Levin Bacon 01/06/2021, 9:31 AM

## 2021-01-06 NOTE — Progress Notes (Addendum)
   01/06/21 0634  Vital Signs  Temp 98.4 F (36.9 C)  Temp Source Oral  Pulse Rate 83  BP (!) 116/96  BP Location Left Arm  BP Method Automatic  Patient Position (if appropriate) Standing   D: Patient denies SI/HI/AVH. Patient rated both anxiety and depression 5/10. Pt. Was out in open areas and was social with staff and peers. Pt. Went down to cafeteria for meals. Pt. Went to groups. A:  Patient took scheduled medicine.  Support and encouragement provided Routine safety checks conducted every 15 minutes. Patient  Informed to notify staff with any concerns.   R:  Safety maintained.

## 2021-01-06 NOTE — Progress Notes (Signed)
Pt identifies another stressor as losing $1,000 to [employer] and having lost his job "due to his emotions and crying." He said that he is working on forgiving himself and trying to improve his mental state for his 58 month old daughter which he is worried about. Pt is passively suicidal without a plan and verbally contracts for safety. He agrees to notify staff immediately for any thoughts of hurting himself or anyone else. He endorses AH of "me arguing with myself." Later around bedtime pt came up to the nurses station because he said that his heart was racing. He said that he was angry because he was expecting a phone call all day and never received one. His vitals were assessed and were within his baseline. Encouraged pt to distract himself by drawing or writing down his goals on paper, which he didn't. He is attention seeking. Pt said that he was trying to use coping skills like counting down from 10 and training his brain to focus on another thought. Pt was able to compose himself and decrease his anxiety by talking to staff. He denies HI and VH. Active listening, reassurance, and support provided. Q 15 min safety checks continue. Pt's safety has been maintained.    01/05/21 2116  Psych Admission Type (Psych Patients Only)  Admission Status Voluntary  Psychosocial Assessment  Patient Complaints Anxiety;Depression;Sadness;Worrying  Eye Contact Glaring  Facial Expression Anxious  Affect Anxious;Appropriate to circumstance;Depressed  Speech Logical/coherent;Loud  Interaction Assertive;Attention-seeking;Intrusive;Needy  Motor Activity Fidgety  Appearance/Hygiene Unremarkable  Behavior Characteristics Cooperative;Appropriate to situation;Anxious;Intrusive  Mood Depressed;Anxious;Sad  Thought Process  Coherency WDL  Content WDL  Delusions None reported or observed  Perception Hallucinations  Hallucination Auditory  Judgment Poor  Confusion None  Danger to Self  Current suicidal ideation? Passive   Self-Injurious Behavior No self-injurious ideation or behavior indicators observed or expressed   Agreement Not to Harm Self Yes  Description of Agreement verbally agrees to notify staff immediately for any thoughts of hurting himself or anyone else  Danger to Others  Danger to Others None reported or observed

## 2021-01-06 NOTE — [facility] Group Notes (Signed)
[facility] Group Notes:  (Nursing/MHT/Case Management/Adjunct)  Date:  01/06/2021  Time:  5:51 PM  Type of Therapy:  Sharing / Getting to know your peers  Participation Level:  Active  Participation Quality:  Attentive, Sharing and Supportive  Affect:  Appropriate  Cognitive:  Alert, Appropriate and Oriented  Insight:  Appropriate and Improving  Engagement in Group:  Engaged and Supportive  Modes of Intervention:  Activity  Summary of Progress/Problems:  Chad Williamson participated well with the group activity.  He answered questions appropriately and was supportive with peers.  Norm Parcel Gerrald Basu 01/06/2021, 5:51 PM

## 2021-01-06 NOTE — Progress Notes (Signed)
[facility] MD Progress Note  01/06/2021 6:50 PM Chad Williamson  MRN:  [id number]   Reason for admission:  Chad Williamson is a 20 year old male with prior history of depression, history of prior suicide attempts and history of nonsuicidal self-injurious behavior admitted secondary to worsening depressive symptoms and suicidal ideation with thoughts of walking into traffic.  Objective: Medical record reviewed.  Patient's case discussed in detail with members of the treatment team.  I met with and evaluated the patient on the unit for follow-up today.  Patient presents with constricted affect and sad mood.  He maintains fair eye contact.  Thought processes are coherent but tangential and thought content is somewhat odd.  Patient reports sad mood today "because I want to be outside."  He reports that he does not want to have an emotional "outburst" but has to work hard at work to avoid having one.  He denies passive wish for death, SI, AI, HI, PI or VH.  He reports that he is sleeping okay.  Appetite remains low.  He reports experiencing auditory hallucinations of "echoes" but denies other hallucinations.  We discussed how patient's difficulty eating could be related to significant cannabis use.  Patient stated understanding.  Patient reports that he would like some help with emotional outbursts and we discussed initiation of Seroquel at bedtime that may help his depression, stabilize his mood and also improve his appetite.  Patient is receptive to addition of Seroquel tonight.  Patient slept 5.5 hours last night.  No new labs today.  Vital signs this morning include BP of 121/92 sitting and 116/96 standing, pulse of 81 sitting and 83 standing and temperature of 98.4.  Principal Problem: MDD (major depressive disorder), recurrent severe, without psychosis ([place]) Diagnosis: Principal Problem:   MDD (major depressive disorder), recurrent severe, without psychosis ([place])  Total Time spent with patient: 25 minutes  Past  Psychiatric History: See admission H&P  Past Medical History:  Past Medical History:  Diagnosis Date  . Depression    History reviewed. No pertinent surgical history. Family History:  Family History  Problem Relation Age of Onset  . Asthma Father    Family Psychiatric  History: See admission H&P Social History:  Social History   Substance and Sexual Activity  Alcohol Use No     Social History   Substance and Sexual Activity  Drug Use Yes  . Types: Marijuana   Comment: last use 09/26/14    Social History   Socioeconomic History  . Marital status: Single    Spouse name: Not on file  . Number of children: Not on file  . Years of education: Not on file  . Highest education level: Not on file  Occupational History  . Not on file  Tobacco Use  . Smoking status: Never Smoker  . Smokeless tobacco: Never Used  Vaping Use  . Vaping Use: Every day  Substance and Sexual Activity  . Alcohol use: No  . Drug use: Yes    Types: Marijuana    Comment: last use 09/26/14  . Sexual activity: Not on file  Other Topics Concern  . Not on file  Social History Narrative  . Not on file   Social Determinants of Health   Financial Resource Strain: Not on file  Food Insecurity: Not on file  Transportation Needs: Not on file  Physical Activity: Not on file  Stress: Not on file  Social Connections: Not on file   Additional Social History:    Pain  Medications: Denies abuse Prescriptions: Denies abuse Over the Counter: Denies abuse History of alcohol / drug use?: Yes Longest period of sobriety (when/how long): Unknown Negative Consequences of Use:  (None) Withdrawal Symptoms:  (None) Name of Substance 1: Marijuana 1 - Age of First Use: 11 1 - Amount (size/oz): "One bong hit" 1 - Frequency: Daily 1 - Duration: Ongoing for years 1 - Last Use / Amount: 01/03/2021 1 - Method of Aquiring: Unknown 1- Route of Use: Smoking                  Sleep: Good  Appetite:   Poor  Current Medications: Current Facility-Administered Medications  Medication Dose Route Frequency Provider Last Rate Last Admin  . acetaminophen (TYLENOL) tablet 650 mg  650 mg Oral Q6H PRN Bobbitt, Shalon E, NP      . alum & mag hydroxide-simeth (MAALOX/MYLANTA) 200-200-20 MG/5ML suspension 30 mL  30 mL Oral Q4H PRN Bobbitt, Shalon E, NP      . citalopram (CELEXA) tablet 20 mg  20 mg Oral Daily Sharma Covert, MD   20 mg at 01/06/21 0749  . feeding supplement (BOOST / RESOURCE BREEZE) liquid 1 Container  1 Container Oral Q24H Sharma Covert, MD   1 Container at 01/06/21 1253  . feeding supplement (ENSURE ENLIVE / ENSURE PLUS) liquid 237 mL  237 mL Oral Q24H Sharma Covert, MD   237 mL at 01/05/21 1839  . hydrOXYzine (ATARAX/VISTARIL) tablet 25 mg  25 mg Oral TID PRN Bobbitt, Shalon E, NP   25 mg at 01/05/21 2116  . multivitamin with minerals tablet 1 tablet  1 tablet Oral Daily Sharma Covert, MD   1 tablet at 01/06/21 0749  . nicotine polacrilex (NICORETTE) gum 2 mg  2 mg Oral PRN Sharma Covert, MD      . QUEtiapine (SEROQUEL) tablet 50 mg  50 mg Oral QHS Arthor Captain, MD      . traZODone (DESYREL) tablet 25 mg  25 mg Oral QHS PRN Bobbitt, Shalon E, NP        Lab Results:  No results found for this or any previous visit (from the past 107 hour(s)).  Blood Alcohol level:  Lab Results  Component Value Date   [facility] <10 01/04/2021   ETH <11 35/70/1779    Metabolic Disorder Labs: Lab Results  Component Value Date   HGBA1C 5.5 01/04/2021   MPG 111 01/04/2021   No results found for: PROLACTIN Lab Results  Component Value Date   CHOL 128 01/04/2021   TRIG 80 01/04/2021   HDL 44 01/04/2021   CHOLHDL 2.9 01/04/2021   VLDL 16 01/04/2021   LDLCALC 68 01/04/2021    Physical Findings: AIMS: Facial and Oral Movements Muscles of Facial Expression: None, normal Lips and Perioral Area: None, normal Jaw: None, normal Tongue: None, normal,Extremity  Movements Upper (arms, wrists, hands, fingers): None, normal Lower (legs, knees, ankles, toes): None, normal, Trunk Movements Neck, shoulders, hips: None, normal, Overall Severity Severity of abnormal movements (highest score from questions above): None, normal Incapacitation due to abnormal movements: None, normal Patient's awareness of abnormal movements (rate only patient's report): No Awareness, Dental Status Current problems with teeth and/or dentures?: No Does patient usually wear dentures?: No  CIWA:    COWS:     Musculoskeletal: Strength & Muscle Tone: within normal limits Gait & Station: normal Patient leans: N/A  Psychiatric Specialty Exam:  Presentation  General Appearance: Casual  Eye Contact:Fair  Speech:Normal  Rate  Speech Volume:Normal  Handedness:Right   Mood and Affect  Mood:Depressed  Affect:Congruent   Thought Process  Thought Processes:Coherent  Descriptions of Associations:Circumstantial  Orientation:Full (Time, Place and Person)  Thought Content:Logical  History of Schizophrenia/Schizoaffective disorder:No  Duration of Psychotic Symptoms:No data recorded Hallucinations:Hallucinations: Auditory Description of Auditory Hallucinations: Reports auditory hallucinations of "echoes"  Ideas of Reference:None  Suicidal Thoughts:Suicidal Thoughts: No SI Active Intent and/or Plan: Without Intent  Homicidal Thoughts:Homicidal Thoughts: No   Sensorium  Memory:Immediate Fair; Recent Fair; Remote Fair  Judgment:Fair  Insight:Lacking   Executive Functions  Concentration:Fair  Attention Span:Fair  [place]   Psychomotor Activity  Psychomotor Activity:Psychomotor Activity: Normal   Assets  Assets:Desire for Improvement; Resilience   Sleep  Sleep:Sleep: Fair Number of Hours of Sleep: 5.5    Physical Exam: Physical Exam Vitals and nursing note reviewed.  Constitutional:       General: He is not in acute distress. HENT:     Head: Normocephalic and atraumatic.  Pulmonary:     Effort: Pulmonary effort is normal.  Neurological:     General: No focal deficit present.     Mental Status: He is alert and oriented to person, place, and time.    Review of Systems  Constitutional: Negative.   Respiratory: Negative.   Cardiovascular: Negative.   Gastrointestinal: Negative.   Neurological: Negative.   Psychiatric/Behavioral: Positive for depression and hallucinations. Negative for suicidal ideas. The patient does not have insomnia.    Blood pressure (!) 116/96, pulse 83, temperature 98.4 F (36.9 C), temperature source Oral, resp. rate 12, height 5' 9"  (1.753 m), weight 58.5 kg, SpO2 100 %. Body mass index is 19.05 kg/m.   Treatment Plan Summary: Daily contact with patient to assess and evaluate symptoms and progress in treatment and Medication management   Continue every 15-minute observation status.  Encourage participation in group therapy and therapeutic milieu.  Depression -Continue Celexa 20 mg daily -Start Seroquel 50 mg at bedtime for adjunctive treatment of mood and sleep  Anxiety -Continue Celexa 20 mg daily -Continue hydroxyzine 93m TID PRN  Insomnia -Continue trazodone 25 mg at bedtime PRN  Low body weight -Continue Boost and Ensure supplements  Discharge planning in progress   MArthor Captain MD 01/06/2021, 6:50 PM

## 2021-01-06 NOTE — Progress Notes (Signed)
Pt described his day as a roller coaster, but much better then yesterday. He said that he had a brief conversation with his Mom before dinner today that went well. Pt has been participating in group and interacting with his peers. He can be silly upon interactions and didn't complain of any irritability tonight. Pt denies SI/HI and and AVH. He denies any side effects from his medications. Active listening, reassurance, and support provided. Medications administered as ordered by provider. Q 15 min safety checks continue. Pt's safety has been maintained.   01/06/21 2110  Psych Admission Type (Psych Patients Only)  Admission Status Voluntary  Psychosocial Assessment  Patient Complaints Anxiety;Depression  Eye Contact Fair  Facial Expression Animated;Anxious  Affect Anxious;Appropriate to circumstance;Silly  Speech Logical/coherent  Interaction Assertive;Attention-seeking;Intrusive  Motor Activity Fidgety  Appearance/Hygiene Unremarkable  Behavior Characteristics Cooperative;Appropriate to situation;Anxious;Fidgety;Intrusive  Mood Depressed;Anxious;Pleasant  Thought Process  Coherency WDL  Content WDL  Delusions None reported or observed  Perception WDL  Hallucination None reported or observed  Judgment Poor  Confusion None  Danger to Self  Current suicidal ideation? Denies  Self-Injurious Behavior No self-injurious ideation or behavior indicators observed or expressed   Agreement Not to Harm Self Yes  Description of Agreement verbally agrees to notify staff immediately for any thoughts of hurting himself or anyone else  Danger to Others  Danger to Others None reported or observed

## 2021-01-07 MED ORDER — QUETIAPINE FUMARATE 100 MG PO TABS
100.0000 mg | ORAL_TABLET | Freq: Every day | ORAL | Status: DC
  Administered 2021-01-07 – 2021-01-09 (×3): 100 mg via ORAL
  Filled 2021-01-07 (×5): qty 1

## 2021-01-07 NOTE — Plan of Care (Signed)
Nurse discussed anxiety, depression and coping skills with patient.  

## 2021-01-07 NOTE — Progress Notes (Signed)
D:  Patient's self inventory sheet, patient has fair sleep, sleep medication helpful.  Poor appetite, normal energy level, good concentration.  Rated depression and anxiety 5, hopeless 4.  Denied withdrawals, checked chilling, agitation, irritability.  Denied SI.  Denied physical problems.  Denied physical pain. Goal is find way to balance between lonely and happy.  Plans to try and understand himself more.  No discharge plans. A:  Medications administered per MD orders.  Emotional support and encouragement given patient.

## 2021-01-07 NOTE — Progress Notes (Signed)
[facility] MD Progress Note  01/07/2021 4:58 PM Chad Williamson  MRN:  [id number]   Reason for admission:  Chad Williamson is a 20 year old male with prior history of depression, history of prior suicide attempts and history of nonsuicidal self-injurious behavior admitted secondary to worsening depressive symptoms and suicidal ideation with thoughts of walking into traffic.  Objective: Medical record reviewed.  Patient's case discussed in detail with members of the treatment team.  I met with and evaluated the patient on the unit for follow-up today.  Patient appears somewhat improved today.  He appears a bit more relaxed and is cooperative and polite with spontaneous speech and more appropriate affect.  He reports feeling less depressed overall and somewhat better at managing his emotions. He is mostly focused on family and financial stressors. Patient reports that emotional outbursts have gotten him into trouble in the past but he has not had any such outbursts in the hospital.  He states that the antidepressant makes him unable to cry when he is sad but denies other medication side effects.  He reports fleeting passive suicidal ideation and fleeting thoughts of getting hit by a car but denies intent or plan to harm himself in the hospital.  He denies AI, HI, PI or VH.  The patient reports that he continues to hear echoes of his own voice or of voices or sounds in the environment.  He denies other hallucinations.  He states that he slept well last night with Seroquel and denies any side effects to it.  His appetite is still limited.  I encouraged him to try to eat what he can and drink Ensure and boost supplements as well as juices and other fluids with calories.  Patient stated understanding and agreement.  He is in agreement with increasing his Seroquel dose tonight with the hope that it will further help his mood and anxiety.  The patient slept 5.5 hours last night.  He has been social with peers on the unit and  has been appropriate in his interactions with peers and staff.  Principal Problem: MDD (major depressive disorder), recurrent severe, without psychosis (Chad Williamson) Diagnosis: Principal Problem:   MDD (major depressive disorder), recurrent severe, without psychosis (Chad Williamson)  Total Time spent with patient: 20 minutes  Past Psychiatric History: See admission H&P  Past Medical History:  Past Medical History:  Diagnosis Date  . Depression    History reviewed. No pertinent surgical history. Family History:  Family History  Problem Relation Age of Onset  . Asthma Father    Family Psychiatric  History: See admission H&P Social History:  Social History   Substance and Sexual Activity  Alcohol Use No     Social History   Substance and Sexual Activity  Drug Use Yes  . Types: Marijuana   Comment: last use 09/26/14    Social History   Socioeconomic History  . Marital status: Single    Spouse name: Not on file  . Number of children: Not on file  . Years of education: Not on file  . Highest education level: Not on file  Occupational History  . Not on file  Tobacco Use  . Smoking status: Never Smoker  . Smokeless tobacco: Never Used  Vaping Use  . Vaping Use: Every day  Substance and Sexual Activity  . Alcohol use: No  . Drug use: Yes    Types: Marijuana    Comment: last use 09/26/14  . Sexual activity: Not on file  Other Topics Concern  .  Not on file  Social History Narrative  . Not on file   Social Determinants of Health   Financial Resource Strain: Not on file  Food Insecurity: Not on file  Transportation Needs: Not on file  Physical Activity: Not on file  Stress: Not on file  Social Connections: Not on file   Additional Social History:    Pain Medications: Denies abuse Prescriptions: Denies abuse Over the Counter: Denies abuse History of alcohol / drug use?: Yes Longest period of sobriety (when/how long): Unknown Negative Consequences of Use:  (None) Withdrawal  Symptoms:  (None) Name of Substance 1: Marijuana 1 - Age of First Use: 11 1 - Amount (size/oz): "One bong hit" 1 - Frequency: Daily 1 - Duration: Ongoing for years 1 - Last Use / Amount: 01/03/2021 1 - Method of Aquiring: Unknown 1- Route of Use: Smoking                  Sleep: Good  Appetite:  Poor  Current Medications: Current Facility-Administered Medications  Medication Dose Route Frequency Provider Last Rate Last Admin  . acetaminophen (TYLENOL) tablet 650 mg  650 mg Oral Q6H PRN Bobbitt, Shalon E, NP      . alum & mag hydroxide-simeth (MAALOX/MYLANTA) 200-200-20 MG/5ML suspension 30 mL  30 mL Oral Q4H PRN Bobbitt, Shalon E, NP      . citalopram (CELEXA) tablet 20 mg  20 mg Oral Daily Sharma Covert, MD   20 mg at 01/07/21 0744  . feeding supplement (BOOST / RESOURCE BREEZE) liquid 1 Container  1 Container Oral Q24H Sharma Covert, MD   1 Container at 01/06/21 1253  . feeding supplement (ENSURE ENLIVE / ENSURE PLUS) liquid 237 mL  237 mL Oral Q24H Sharma Covert, MD   237 mL at 01/06/21 2057  . hydrOXYzine (ATARAX/VISTARIL) tablet 25 mg  25 mg Oral TID PRN Bobbitt, Shalon E, NP   25 mg at 01/05/21 2116  . multivitamin with minerals tablet 1 tablet  1 tablet Oral Daily Sharma Covert, MD   1 tablet at 01/07/21 0745  . nicotine polacrilex (NICORETTE) gum 2 mg  2 mg Oral PRN Sharma Covert, MD      . QUEtiapine (SEROQUEL) tablet 100 mg  100 mg Oral QHS Arthor Captain, MD      . traZODone (DESYREL) tablet 25 mg  25 mg Oral QHS PRN Bobbitt, Shalon E, NP        Lab Results:  No results found for this or any previous visit (from the past 7 hour(s)).  Blood Alcohol level:  Lab Results  Component Value Date   [facility] <10 01/04/2021   ETH <11 16/05/9603    Metabolic Disorder Labs: Lab Results  Component Value Date   HGBA1C 5.5 01/04/2021   MPG 111 01/04/2021   No results found for: PROLACTIN Lab Results  Component Value Date   CHOL 128 01/04/2021    TRIG 80 01/04/2021   HDL 44 01/04/2021   CHOLHDL 2.9 01/04/2021   VLDL 16 01/04/2021   LDLCALC 68 01/04/2021    Physical Findings: AIMS: Facial and Oral Movements Muscles of Facial Expression: None, normal Lips and Perioral Area: None, normal Jaw: None, normal Tongue: None, normal,Extremity Movements Upper (arms, wrists, hands, fingers): None, normal Lower (legs, knees, ankles, toes): None, normal, Trunk Movements Neck, shoulders, hips: None, normal, Overall Severity Severity of abnormal movements (highest score from questions above): None, normal Incapacitation due to abnormal movements: None, normal Patient's awareness  of abnormal movements (rate only patient's report): No Awareness, Dental Status Current problems with teeth and/or dentures?: No Does patient usually wear dentures?: No  CIWA:    COWS:     Musculoskeletal: Strength & Muscle Tone: within normal limits Gait & Station: normal Patient leans: N/A  Psychiatric Specialty Exam:  Presentation  General Appearance: Casual  Eye Contact:Fair  Speech:Clear and Coherent; Normal Rate  Speech Volume:Normal  Handedness:Right   Mood and Affect  Mood:Depressed ("Less depressed.")  Affect:Congruent   Thought Process  Thought Processes:Coherent; Goal Directed  Descriptions of Associations:Circumstantial  Orientation:Full (Time, Place and Person)  Thought Content:Logical  History of Schizophrenia/Schizoaffective disorder:No  Duration of Psychotic Symptoms:No data recorded Hallucinations:Hallucinations: Auditory Description of Auditory Hallucinations: Reports auditory hallucinations of "echoes"  Ideas of Reference:None  Suicidal Thoughts:Suicidal Thoughts: Yes, Passive SI Passive Intent and/or Plan: Without Intent; Without Plan  Homicidal Thoughts:Homicidal Thoughts: No   Sensorium  Memory:Immediate Fair; Recent Fair; Remote Fair  Judgment:Fair  Insight:Shallow   Executive Functions   Concentration:Fair  Attention Span:Fair  [place]  Language:Good   Psychomotor Activity  Psychomotor Activity:Psychomotor Activity: Normal   Assets  Assets:Communication Skills; Desire for Improvement; Resilience   Sleep  Sleep:Sleep: Fair Number of Hours of Sleep: 5.5    Physical Exam: Physical Exam Vitals and nursing note reviewed.  Constitutional:      General: He is not in acute distress. HENT:     Head: Normocephalic and atraumatic.  Pulmonary:     Effort: Pulmonary effort is normal.  Neurological:     General: No focal deficit present.     Mental Status: He is alert and oriented to person, place, and time.    Review of Systems  Constitutional: Negative.   Respiratory: Negative.   Cardiovascular: Negative.   Gastrointestinal: Negative.   Neurological: Negative.   Psychiatric/Behavioral: Positive for depression and hallucinations. Negative for suicidal ideas. The patient does not have insomnia.    Blood pressure (!) 118/93, pulse (!) 103, temperature 98.3 F (36.8 C), temperature source Oral, resp. rate 12, height 5' 9"  (1.753 m), weight 58.5 kg, SpO2 100 %. Body mass index is 19.05 kg/m.   Treatment Plan Summary: Daily contact with patient to assess and evaluate symptoms and progress in treatment and Medication management   Continue every 15-minute observation status.  Encourage participation in group therapy and therapeutic milieu.  Depression -Continue Celexa 20 mg daily -Increase Seroquel to 100 mg at bedtime for adjunctive treatment of mood, anxiety and sleep  Anxiety -Continue Celexa 20 mg daily -Continue hydroxyzine 40m TID PRN  Insomnia -Continue trazodone 25 mg at bedtime PRN  Low body weight -Continue Boost and Ensure supplements  Discharge planning in progress   MArthor Captain MD 01/07/2021, 4:58 PM

## 2021-01-07 NOTE — [facility] Group Notes (Signed)
[facility] Group Notes:  (Nursing/MHT/Case Management/Adjunct)  Date:  01/07/2021  Time:  10:03 AM  Type of Therapy:  Goals group  Participation Level:  Minimal  Participation Quality:  Inattentive  Affect:  Depressed  Cognitive:  Lacking  Insight:  Lacking  Engagement in Group:  Lacking  Modes of Intervention:  Discussion and Education  Summary of Progress/Problems:  Haleem reported he slept "alright" but would not elaborate.  Norm Parcel Reedy Biernat 01/07/2021, 10:03 AM

## 2021-01-07 NOTE — Progress Notes (Signed)
Recreation Therapy Notes  Date: 6.8.22 Time: 0930 Location: [address] Dayroom  Group Topic: Stress Management   Goal Area(s) Addresses:  Patient will actively participate in stress management techniques presented during session.  Patient will successfully identify benefit of practicing stress management post d/c.   Behavioral Response: Appropriate  Intervention: Guided exercise with ambient sound and script  Activity :Guided Imagery  LRT provided education, instruction, and demonstration on practice of visualization via guided imagery. Patient was asked to participate in the technique introduced during session. LRT also debriefed including topics of mindfulness, stress management and specific scenarios each patient could use these techniques.    Education:  Stress Management, Discharge Planning.   Education Outcome: Acknowledges education  Clinical Observations/Feedback: Patient actively engaged in technique introduced, expressed no concerns and demonstrated ability to practice independently post d/c.     Caroll Rancher, LRT/CTRS         Caroll Rancher A 01/07/2021 11:50 AM

## 2021-01-07 NOTE — [facility] Group Notes (Signed)
LCSW Group Therapy Note   01/07/2021   Type of Therapy and Topic:  Group Therapy:  Positive Affirmations   Participation Level:  Active  Description of Group: This group addressed positive affirmation toward self and others. Patients went around the room and identified two positive things about themselves and two positive things about a peer in the room. Patients reflected on how it felt to share something positive with others, to identify positive things about themselves, and to hear positive things from others. Patients were encouraged to have a daily reflection of positive characteristics or circumstances.  Therapeutic Goals Patient will verbalize two of their positive qualities Patient will demonstrate empathy for others by stating two positive qualities about a peer in the group Patient will verbalize their feelings when voicing positive self affirmations and when voicing positive affirmations of others Patients will discuss the potential positive impact on their wellness/recovery of focusing on positive traits of self and others. Summary of Patient Progress:Pt interacted appropriately with peers and participated in group.    Therapeutic Modalities Cognitive Behavioral Therapy Motivational Interviewing  Chrys Racer 01/07/2021 1:34 PM

## 2021-01-08 NOTE — Progress Notes (Signed)
Adult Psychoeducational Group Note  Date:  01/08/2021 Time:  4:58 PM  Group Topic/Focus:  Making Healthy Choices:   The focus of this group is to help patients identify negative/unhealthy choices they were using prior to admission and identify positive/healthier coping strategies to replace them upon discharge.  Participation Level:  Active  Participation Quality:  Appropriate and Attentive  Affect:  Anxious  Cognitive:  Alert and Appropriate  Insight: Appropriate and Good  Engagement in Group:  Engaged  Modes of Intervention:  Discussion  Additional Comments:  In group we discussed forgiving ourselves and establishing some values for a healthy foundation. Pt attended and participated in the group.     Chad Williamson 01/08/2021, 4:58 PM

## 2021-01-08 NOTE — Progress Notes (Signed)
[facility] MD Progress Note  01/08/2021 3:17 PM Chad Williamson  MRN:  [id number]   Subjective: Chad Williamson reports, "I came here to get my mood right again. I was having difficulty dealing with life situations prior to me coming to the hospital. I did not like being away from my 42 months old daughter. I'm not depressed today just sad. Can I not take the sleep medicines again. I don't like how I feel after taking it. Just stop it".   Reason for admission:  Chad Williamson is a 20 year old male with prior history of depression, history of prior suicide attempts and history of nonsuicidal self-injurious behavior admitted secondary to worsening depressive symptoms and suicidal ideation with thoughts of walking into traffic.  Objective: Patient's record reviewed.  Patient's case discussed in detail with members of the treatment team.  I met with and evaluated the patient on the unit for follow-up today.  Chad Williamson continues to report improved mood & decreased symptoms of depression today.  He appears a bit more relaxed and is cooperative and polite with spontaneous speech and more appropriate affect, however, he reports feeling sad because he is away from his daughter. He reports feeling less depressed overall and somewhat better at managing his emotions. He is mostly focused on family (his daughter, being away from her) and financial stressors. He plans to get a job after discharge. He states that the Trazodone at night makes him drowsy in the morning, wants it discontinued, but denies other medication side effects. He denies any SIHI, AVH, delusional thoughts or paranoia. He does not appear to be responding to any internal stimuli. He states that he slept well last night with Seroquel and denies any side effects to it. He continues to report poor appetite, has Ensure and boost supplements as well as juices and other fluids with calories to use prn. He has been social with peers on the unit and has been appropriate in his  interactions with peers and staff. He is attending group sessions. Other than the discontinuation of Trazodone per patient's request, no other changes made on his treatment plan. Will continue current plan of care as already in progress.  Principal Problem: MDD (major depressive disorder), recurrent severe, without psychosis ([place])  Diagnosis: Principal Problem:   MDD (major depressive disorder), recurrent severe, without psychosis ([place])  Total Time spent with patient: 15 minutes  Past Psychiatric History: See admission H&P  Past Medical History:  Past Medical History:  Diagnosis Date   Depression    History reviewed. No pertinent surgical history. Family History:  Family History  Problem Relation Age of Onset   Asthma Father    Family Psychiatric  History: See admission H&P  Social History:  Social History   Substance and Sexual Activity  Alcohol Use No     Social History   Substance and Sexual Activity  Drug Use Yes   Types: Marijuana   Comment: last use 09/26/14    Social History   Socioeconomic History   Marital status: Single    Spouse name: Not on file   Number of children: Not on file   Years of education: Not on file   Highest education level: Not on file  Occupational History   Not on file  Tobacco Use   Smoking status: Never   Smokeless tobacco: Never  Vaping Use   Vaping Use: Every day  Substance and Sexual Activity   Alcohol use: No   Drug use: Yes    Types: Marijuana  Comment: last use 09/26/14   Sexual activity: Not on file  Other Topics Concern   Not on file  Social History Narrative   Not on file   Social Determinants of Health   Financial Resource Strain: Not on file  Food Insecurity: Not on file  Transportation Needs: Not on file  Physical Activity: Not on file  Stress: Not on file  Social Connections: Not on file   Additional Social History:    Pain Medications: Denies abuse Prescriptions: Denies abuse Over the Counter:  Denies abuse History of alcohol / drug use?: Yes Longest period of sobriety (when/how long): Unknown Negative Consequences of Use:  (None) Withdrawal Symptoms:  (None) Name of Substance 1: Marijuana 1 - Age of First Use: 11 1 - Amount (size/oz): "One bong hit" 1 - Frequency: Daily 1 - Duration: Ongoing for years 1 - Last Use / Amount: 01/03/2021 1 - Method of Aquiring: Unknown 1- Route of Use: Smoking  Sleep: Good  Appetite:  Poor  Current Medications: Current Facility-Administered Medications  Medication Dose Route Frequency Provider Last Rate Last Admin   acetaminophen (TYLENOL) tablet 650 mg  650 mg Oral Q6H PRN Bobbitt, Shalon E, NP       alum & mag hydroxide-simeth (MAALOX/MYLANTA) 200-200-20 MG/5ML suspension 30 mL  30 mL Oral Q4H PRN Bobbitt, Shalon E, NP       citalopram (CELEXA) tablet 20 mg  20 mg Oral Daily Sharma Covert, MD   20 mg at 01/08/21 0900   feeding supplement (BOOST / RESOURCE BREEZE) liquid 1 Container  1 Container Oral Q24H Sharma Covert, MD   1 Container at 01/08/21 1028   feeding supplement (ENSURE ENLIVE / ENSURE PLUS) liquid 237 mL  237 mL Oral Q24H Sharma Covert, MD   237 mL at 01/06/21 2057   hydrOXYzine (ATARAX/VISTARIL) tablet 25 mg  25 mg Oral TID PRN Bobbitt, Hessie Diener E, NP   25 mg at 01/05/21 2116   multivitamin with minerals tablet 1 tablet  1 tablet Oral Daily Sharma Covert, MD   1 tablet at 01/08/21 0900   nicotine polacrilex (NICORETTE) gum 2 mg  2 mg Oral PRN Sharma Covert, MD       QUEtiapine (SEROQUEL) tablet 100 mg  100 mg Oral QHS Arthor Captain, MD   100 mg at 01/07/21 2120   Lab Results:  No results found for this or any previous visit (from the past 48 hour(s)).  Blood Alcohol level:  Lab Results  Component Value Date   [facility] <10 01/04/2021   ETH <11 95/63/8756   Metabolic Disorder Labs: Lab Results  Component Value Date   HGBA1C 5.5 01/04/2021   MPG 111 01/04/2021   No results found for: PROLACTIN Lab  Results  Component Value Date   CHOL 128 01/04/2021   TRIG 80 01/04/2021   HDL 44 01/04/2021   CHOLHDL 2.9 01/04/2021   VLDL 16 01/04/2021   LDLCALC 68 01/04/2021   Physical Findings: AIMS: Facial and Oral Movements Muscles of Facial Expression: None, normal Lips and Perioral Area: None, normal Jaw: None, normal Tongue: None, normal,Extremity Movements Upper (arms, wrists, hands, fingers): None, normal Lower (legs, knees, ankles, toes): None, normal, Trunk Movements Neck, shoulders, hips: None, normal, Overall Severity Severity of abnormal movements (highest score from questions above): None, normal Incapacitation due to abnormal movements: None, normal Patient's awareness of abnormal movements (rate only patient's report): No Awareness, Dental Status Current problems with teeth and/or dentures?: No Does patient  usually wear dentures?: No  CIWA:    COWS:     Musculoskeletal: Strength & Muscle Tone: within normal limits Gait & Station: normal Patient leans: N/A  Psychiatric Specialty Exam:  Presentation  General Appearance: Casual  Eye Contact:Fair  Speech:Clear and Coherent; Normal Rate  Speech Volume:Normal  Handedness:Right  Mood and Affect  Mood:Depressed ("Less depressed.")  Affect:Congruent  Thought Process  Thought Processes:Coherent; Goal Directed  Descriptions of Associations:Circumstantial  Orientation:Full (Time, Place and Person)  Thought Content:Logical  History of Schizophrenia/Schizoaffective disorder:No  Duration of Psychotic Symptoms:No data recorded Hallucinations:Hallucinations: Auditory Description of Auditory Hallucinations: Reports auditory hallucinations of "echoes"  Ideas of Reference:None  Suicidal Thoughts:Suicidal Thoughts: Yes, Passive SI Passive Intent and/or Plan: Without Intent; Without Plan  Homicidal Thoughts:Homicidal Thoughts: No  Sensorium  Memory:Immediate Fair; Recent Fair; Remote  Fair  Judgment:Fair  Insight:Shallow  Executive Functions  Concentration:Fair  Attention Span:Fair  Time  Language:Good  Psychomotor Activity  Psychomotor Activity:Psychomotor Activity: Normal  Assets  Assets:Communication Skills; Desire for Improvement; Resilience  Sleep  Sleep:Sleep: Fair Number of Hours of Sleep: 5.5  Physical Exam: Physical Exam Vitals and nursing note reviewed.  Constitutional:      General: He is not in acute distress. HENT:     Head: Normocephalic and atraumatic.     Nose: Nose normal.     Mouth/Throat:     Pharynx: Oropharynx is clear.  Eyes:     Pupils: Pupils are equal, round, and reactive to light.  Cardiovascular:     Rate and Rhythm: Normal rate.     Pulses: Normal pulses.  Pulmonary:     Effort: Pulmonary effort is normal.  Genitourinary:    Comments: Deferred Musculoskeletal:        General: Normal range of motion.     Cervical back: Normal range of motion.  Skin:    General: Skin is warm and dry.  Neurological:     General: No focal deficit present.     Mental Status: He is alert and oriented to person, place, and time.   Review of Systems  Constitutional: Negative.   HENT: Negative.    Eyes: Negative.   Respiratory: Negative.    Cardiovascular: Negative.   Gastrointestinal: Negative.   Genitourinary: Negative.   Musculoskeletal: Negative.   Skin: Negative.   Neurological: Negative.   Psychiatric/Behavioral:  Positive for depression, hallucinations and substance abuse (Hx THC). Negative for memory loss and suicidal ideas. The patient is nervous/anxious ("Improving"). The patient does not have insomnia.   Blood pressure (!) 113/92, pulse 96, temperature 97.8 F (36.6 C), temperature source Oral, resp. rate 20, height 5' 9"  (1.753 m), weight 58.5 kg, SpO2 100 %. Body mass index is 19.05 kg/m.  Treatment Plan Summary: Daily contact with patient to assess and evaluate symptoms and  progress in treatment and Medication management.  Continue inpatient hospitalization.  Will continue today 01/08/2021 plan as below except where it is noted.   Continue every 15-minute observation status. Encourage participation in group therapy and therapeutic milieu.  Depression -Continue Celexa 20 mg daily -Continue Seroquel100 mg at bedtime for adjunctive treatment of mood, anxiety and sleep  Anxiety -Continue Celexa 20 mg daily -Continue hydroxyzine 89m TID PRN  Insomnia -Discontinued trazodone 25 mg at bedtime PRN, patient declined to take.  Low body weight -Continue Boost and Ensure supplements  Discharge planning in progress.  ALindell Spar NP, pmhnp, fnp-bc 01/08/2021, 3:17 PM

## 2021-01-08 NOTE — [facility] Suicide Risk Assessment (Signed)
[facility] INPATIENT:  Family/Significant Other Suicide Prevention Education  Suicide Prevention Education:  Contact Attempts: Chad Williamson (mother) [phone]   , (name of family member/significant other) has been identified by the patient as the family member/significant other with whom the patient will be residing, and identified as the person(s) who will aid the patient in the event of a mental health crisis.  With written consent from the patient, two attempts were made to provide suicide prevention education, prior to and/or following the patient's discharge.  We were unsuccessful in providing suicide prevention education.  A suicide education pamphlet was given to the patient to share with family/significant other.  Date and time of first attempt:6/9    /       3:27pm  CSW left a HIPAA compliant message.  Date and time of second attempt:CSW will make another attempt at a later time.   Chad Williamson 01/08/2021, 3:29 PM

## 2021-01-08 NOTE — Progress Notes (Signed)
   01/07/21 2120  Psych Admission Type (Psych Patients Only)  Admission Status Voluntary  Psychosocial Assessment  Patient Complaints None  Eye Contact Fair  Facial Expression Animated  Affect Appropriate to circumstance  Speech Logical/coherent  Interaction Assertive  Motor Activity Fidgety  Appearance/Hygiene Unremarkable  Behavior Characteristics Cooperative;Appropriate to situation  Mood Anxious;Pleasant  Thought Process  Coherency WDL  Content WDL  Delusions None reported or observed  Perception WDL  Hallucination None reported or observed  Judgment Poor  Confusion None  Danger to Self  Current suicidal ideation? Denies  Self-Injurious Behavior No self-injurious ideation or behavior indicators observed or expressed   Agreement Not to Harm Self Yes  Description of Agreement verbal contract  Danger to Others  Danger to Others None reported or observed

## 2021-01-08 NOTE — Progress Notes (Signed)
Adult Psychoeducational Group Note  Date:  01/08/2021 Time:  10:50 AM  Group Topic/Focus:  Goals Group:   The focus of this group is to help patients establish daily goals to achieve during treatment and discuss how the patient can incorporate goal setting into their daily lives to aide in recovery.  Participation Level:  Active  Participation Quality:  Appropriate and Attentive  Affect:  Anxious  Cognitive:  Alert and Appropriate  Insight: Appropriate and Good  Engagement in Group:  Engaged  Modes of Intervention:  Discussion  Additional Comments:  Pt had a goal of learning how to forgive himself. Also, learning how to develop a support system.   Chad Williamson Chad Williamson 01/08/2021, 10:50 AM

## 2021-01-08 NOTE — Progress Notes (Signed)
   01/08/21 1300  Psych Admission Type (Psych Patients Only)  Admission Status Voluntary  Psychosocial Assessment  Patient Complaints Anxiety  Eye Contact Fair  Facial Expression Animated  Affect Appropriate to circumstance  Speech Logical/coherent  Interaction Assertive  Motor Activity Fidgety  Appearance/Hygiene Unremarkable  Behavior Characteristics Cooperative;Appropriate to situation  Mood Anxious;Pleasant  Thought Process  Coherency WDL  Content WDL  Delusions None reported or observed  Perception WDL  Hallucination None reported or observed  Judgment Poor  Confusion None  Danger to Self  Current suicidal ideation? Denies  Self-Injurious Behavior No self-injurious ideation or behavior indicators observed or expressed   Agreement Not to Harm Self Yes  Description of Agreement verbal contract  Danger to Others  Danger to Others None reported or observed

## 2021-01-08 NOTE — Progress Notes (Signed)
[facility] Group Notes:  (Nursing/MHT/Case Management/Adjunct)  Date:  01/08/2021  Time:  2015 Type of Therapy:   wrap up group  Participation Level:  Active  Participation Quality:  Appropriate, Attentive, Sharing, and Supportive  Affect:  Anxious  Cognitive:  Alert  Insight:  Improving  Engagement in Group:  Engaged  Modes of Intervention:  Clarification, Education, and Support  Summary of Progress/Problems:Positive thinking and self-care were discussed.   Marcille Buffy 01/08/2021, 8:58 PM

## 2021-01-09 NOTE — Progress Notes (Signed)
D Alert and Oriented Presents with organized thought process. Compliant with treatment.   A Scheduled medications administered per Provider order. Support and encouragement provided. Routine safety checks conducted every 15 minutes. Patient notified to inform staff with problems or concerns.  R. No adverse drug reactions noted. Patient contracts for safety at this time. Will continue to monitor Patient.   

## 2021-01-09 NOTE — Progress Notes (Signed)
Adult Psychoeducational Group Note  Date:  01/09/2021 Time:  12:05 PM  Group Topic/Focus:  Managing Feelings:   The focus of this group is to identify what feelings patients have difficulty handling and develop a plan to handle them in a healthier way upon discharge.  Participation Level:  Active  Participation Quality:  Appropriate and Attentive  Affect:  Appropriate  Cognitive:  Alert and Appropriate  Insight: Appropriate and Good  Engagement in Group:  Engaged  Modes of Intervention:  Education  Additional Comments: Pt attended and participated the managing feelings group discussion.   Chad Williamson 01/09/2021, 12:05 PM

## 2021-01-09 NOTE — Tx Team (Signed)
Interdisciplinary Treatment and Diagnostic Plan Update  01/09/2021 Time of Session: 9:40am Chad Williamson MRN: [id number]  Principal Diagnosis: MDD (major depressive disorder), recurrent severe, without psychosis (HCC)  Secondary Diagnoses: Principal Problem:   MDD (major depressive disorder), recurrent severe, without psychosis (HCC)   Current Medications:  Current Facility-Administered Medications  Medication Dose Route Frequency Provider Last Rate Last Admin   acetaminophen (TYLENOL) tablet 650 mg  650 mg Oral Q6H PRN Bobbitt, Shalon E, NP       alum & mag hydroxide-simeth (MAALOX/MYLANTA) 200-200-20 MG/5ML suspension 30 mL  30 mL Oral Q4H PRN Bobbitt, Shalon E, NP       citalopram (CELEXA) tablet 20 mg  20 mg Oral Daily Antonieta Pert, MD   20 mg at 01/09/21 0834   feeding supplement (BOOST / RESOURCE BREEZE) liquid 1 Container  1 Container Oral Q24H Antonieta Pert, MD   1 Container at 01/08/21 1028   feeding supplement (ENSURE ENLIVE / ENSURE PLUS) liquid 237 mL  237 mL Oral Q24H Antonieta Pert, MD   237 mL at 01/08/21 2109   hydrOXYzine (ATARAX/VISTARIL) tablet 25 mg  25 mg Oral TID PRN Bobbitt, Ysidro Evert E, NP   25 mg at 01/05/21 2116   multivitamin with minerals tablet 1 tablet  1 tablet Oral Daily Antonieta Pert, MD   1 tablet at 01/09/21 9417   nicotine polacrilex (NICORETTE) gum 2 mg  2 mg Oral PRN Antonieta Pert, MD       QUEtiapine (SEROQUEL) tablet 100 mg  100 mg Oral QHS Claudie Revering, MD   100 mg at 01/08/21 2109   PTA Medications: No medications prior to admission.    Patient Stressors: Marital or family conflict Occupational concerns  Patient Strengths: Active sense of humor Average or above average intelligence Communication skills Motivation for treatment/growth  Treatment Modalities: Medication Management, Group therapy, Case management,  1 to 1 session with clinician, Psychoeducation, Recreational therapy.   Physician Treatment Plan for  Primary Diagnosis: MDD (major depressive disorder), recurrent severe, without psychosis (HCC) Long Term Goal(s): Improvement in symptoms so as ready for discharge Improvement in symptoms so as ready for discharge   Short Term Goals: Ability to identify changes in lifestyle to reduce recurrence of condition will improve Ability to verbalize feelings will improve Ability to disclose and discuss suicidal ideas Ability to demonstrate self-control will improve Ability to identify and develop effective coping behaviors will improve Ability to maintain clinical measurements within normal limits will improve Ability to identify triggers associated with substance abuse/mental health issues will improve Ability to identify changes in lifestyle to reduce recurrence of condition will improve Ability to verbalize feelings will improve Ability to disclose and discuss suicidal ideas Ability to demonstrate self-control will improve Ability to identify and develop effective coping behaviors will improve Ability to maintain clinical measurements within normal limits will improve Ability to identify triggers associated with substance abuse/mental health issues will improve  Medication Management: Evaluate patient's response, side effects, and tolerance of medication regimen.  Therapeutic Interventions: 1 to 1 sessions, Unit Group sessions and Medication administration.  Evaluation of Outcomes: Progressing  Physician Treatment Plan for Secondary Diagnosis: Principal Problem:   MDD (major depressive disorder), recurrent severe, without psychosis (HCC)  Long Term Goal(s): Improvement in symptoms so as ready for discharge Improvement in symptoms so as ready for discharge   Short Term Goals: Ability to identify changes in lifestyle to reduce recurrence of condition will improve Ability to verbalize feelings will  improve Ability to disclose and discuss suicidal ideas Ability to demonstrate self-control will  improve Ability to identify and develop effective coping behaviors will improve Ability to maintain clinical measurements within normal limits will improve Ability to identify triggers associated with substance abuse/mental health issues will improve Ability to identify changes in lifestyle to reduce recurrence of condition will improve Ability to verbalize feelings will improve Ability to disclose and discuss suicidal ideas Ability to demonstrate self-control will improve Ability to identify and develop effective coping behaviors will improve Ability to maintain clinical measurements within normal limits will improve Ability to identify triggers associated with substance abuse/mental health issues will improve     Medication Management: Evaluate patient's response, side effects, and tolerance of medication regimen.  Therapeutic Interventions: 1 to 1 sessions, Unit Group sessions and Medication administration.  Evaluation of Outcomes: Progressing   RN Treatment Plan for Primary Diagnosis: MDD (major depressive disorder), recurrent severe, without psychosis (HCC) Long Term Goal(s): Knowledge of disease and therapeutic regimen to maintain health will improve  Short Term Goals: Ability to remain free from injury will improve, Ability to verbalize frustration and anger appropriately will improve, Ability to demonstrate self-control, Ability to identify and develop effective coping behaviors will improve and Compliance with prescribed medications will improve  Medication Management: RN will administer medications as ordered by provider, will assess and evaluate patient's response and provide education to patient for prescribed medication. RN will report any adverse and/or side effects to prescribing provider.  Therapeutic Interventions: 1 on 1 counseling sessions, Psychoeducation, Medication administration, Evaluate responses to treatment, Monitor vital signs and CBGs as ordered, Perform/monitor  CIWA, COWS, AIMS and Fall Risk screenings as ordered, Perform wound care treatments as ordered.  Evaluation of Outcomes: Progressing   LCSW Treatment Plan for Primary Diagnosis: MDD (major depressive disorder), recurrent severe, without psychosis (HCC) Long Term Goal(s): Safe transition to appropriate next level of care at discharge, Engage patient in therapeutic group addressing interpersonal concerns.  Short Term Goals: Engage patient in aftercare planning with referrals and resources, Increase social support, Increase ability to appropriately verbalize feelings, Identify triggers associated with mental health/substance abuse issues and Increase skills for wellness and recovery  Therapeutic Interventions: Assess for all discharge needs, 1 to 1 time with Social worker, Explore available resources and support systems, Assess for adequacy in community support network, Educate family and significant other(s) on suicide prevention, Complete Psychosocial Assessment, Interpersonal group therapy.  Evaluation of Outcomes: Progressing   Progress in Treatment: Attending groups: Yes. Participating in groups: Yes. Taking medication as prescribed: Yes. Toleration medication: Yes. Family/Significant other contact made: No, will contact:  mother Patient understands diagnosis: Yes. Discussing patient identified problems/goals with staff: Yes. Medical problems stabilized or resolved: Yes. Denies suicidal/homicidal ideation: Yes. Issues/concerns per patient self-inventory: No.   New problem(s) identified: No, Describe:  none  New Short Term/Long Term Goal(s): medication stabilization, elimination of SI thoughts, development of comprehensive mental wellness plan.   Patient Goals:  Did not attend  Discharge Plan or Barriers: Patient is to be referred to [facility] for medication management and therapy.  Reason for Continuation of Hospitalization: Depression Medication stabilization Suicidal  ideation  Estimated Length of Stay: 3-5 days  Attendees: Patient: Did not attend 01/05/2021   Physician: Clifton Custard, MD 01/05/2021  Nursing:  01/05/2021   RN Care Manager: 01/05/2021   Social Worker: Ruthann Cancer, LCSW 01/05/2021  Recreational Therapist:  01/05/2021  Other:  01/05/2021  Other:  01/05/2021   Other: 01/05/2021  Scribe for Treatment Team: Chrys Racer 01/09/2021 10:20 AM

## 2021-01-09 NOTE — Progress Notes (Signed)
   01/08/21 2109  Psych Admission Type (Psych Patients Only)  Admission Status Voluntary  Psychosocial Assessment  Patient Complaints None  Eye Contact Fair  Facial Expression Animated  Affect Appropriate to circumstance  Speech Logical/coherent  Interaction Assertive  Motor Activity Fidgety  Appearance/Hygiene Unremarkable  Behavior Characteristics Cooperative;Appropriate to situation  Mood Pleasant  Thought Process  Coherency WDL  Content WDL  Delusions None reported or observed  Perception WDL  Hallucination None reported or observed  Judgment Poor  Confusion None  Danger to Self  Current suicidal ideation? Denies  Self-Injurious Behavior No self-injurious ideation or behavior indicators observed or expressed   Agreement Not to Harm Self Yes  Description of Agreement verbal contract  Danger to Others  Danger to Others None reported or observed

## 2021-01-09 NOTE — Progress Notes (Signed)
Adult Psychoeducational Group Note  Date:  01/09/2021 Time:  10:47 AM  Group Topic/Focus:  Goals Group:   The focus of this group is to help patients establish daily goals to achieve during treatment and discuss how the patient can incorporate goal setting into their daily lives to aide in recovery.  Participation Level:  Active  Participation Quality:  Appropriate and Attentive  Affect:  Appropriate  Cognitive:  Alert and Appropriate  Insight: Appropriate and Good  Engagement in Group:  Engaged  Modes of Intervention:  Discussion  Additional Comments:  Pt was proud of himself and was happy he completed his goal from yesterday of forgiving himself. Today pt wishes to continue to work on his positive thinking.   Deforest Hoyles Liborio Saccente 01/09/2021, 10:47 AM

## 2021-01-09 NOTE — Progress Notes (Signed)
[facility] MD Progress Note  01/09/2021 3:06 PM Chad Williamson  MRN:  [id number]   Subjective: Chad Williamson reports, "I'm doing great. My mood is good. I would have wanted to be discharged today, but it is a little too late to start arranging transportation. I will just wait till tomorrow. I'm feeling good".  Reason for admission:  Chad Williamson is a 20 year old male with prior history of depression, history of prior suicide attempts and history of nonsuicidal self-injurious behavior admitted secondary to worsening depressive symptoms and suicidal ideation with thoughts of walking into traffic.  Objective: Patient's record reviewed.  Patient's case discussed in detail with members of the treatment team.  I met with and evaluated the patient on the unit for follow-up today.  Chad Williamson continues to report improved mood & decreased symptoms of depression today.  He appears more relaxed and is cooperative and polite with spontaneous speech and more appropriate affect. He is visible on the unit attending group sessions. He says he would have wanted to be discharged today to the home of his mother, but, a little late to start arranging transportation. He says his mother lives in [place], [place] that it takes about a good 30-35 minutes to get to [place]. He still plans to get a job after discharge. He denies any drowsiness today. Says he is doing well on his medications. Denies any side effects. He denies any SIHI, AVH, delusional thoughts or paranoia. He does not appear to be responding to any internal stimuli. He states that he slept well last night. He report improved appetite, has Ensure and boost supplements as well as juices and other fluids with calories to use prn. He has been social with peers on the unit and has been appropriate in his interactions with peers and staff. He is attending group sessions. Other than the discontinuation of Trazodone per patient's request yesterday no other changes made on his treatment plan.  Will continue current plan of care as already in progress.  Principal Problem: MDD (major depressive disorder), recurrent severe, without psychosis ([place])  Diagnosis: Principal Problem:   MDD (major depressive disorder), recurrent severe, without psychosis ([place])  Total Time spent with patient: 15 minutes  Past Psychiatric History: See admission H&P  Past Medical History:  Past Medical History:  Diagnosis Date   Depression    History reviewed. No pertinent surgical history. Family History:  Family History  Problem Relation Age of Onset   Asthma Father    Family Psychiatric  History: See admission H&P  Social History:  Social History   Substance and Sexual Activity  Alcohol Use No     Social History   Substance and Sexual Activity  Drug Use Yes   Types: Marijuana   Comment: last use 09/26/14    Social History   Socioeconomic History   Marital status: Single    Spouse name: Not on file   Number of children: Not on file   Years of education: Not on file   Highest education level: Not on file  Occupational History   Not on file  Tobacco Use   Smoking status: Never   Smokeless tobacco: Never  Vaping Use   Vaping Use: Every day  Substance and Sexual Activity   Alcohol use: No   Drug use: Yes    Types: Marijuana    Comment: last use 09/26/14   Sexual activity: Not on file  Other Topics Concern   Not on file  Social History Narrative   Not on  file   Social Determinants of Health   Financial Resource Strain: Not on file  Food Insecurity: Not on file  Transportation Needs: Not on file  Physical Activity: Not on file  Stress: Not on file  Social Connections: Not on file   Additional Social History:    Pain Medications: Denies abuse Prescriptions: Denies abuse Over the Counter: Denies abuse History of alcohol / drug use?: Yes Longest period of sobriety (when/how long): Unknown Negative Consequences of Use:  (None) Withdrawal Symptoms:  (None) Name of  Substance 1: Marijuana 1 - Age of First Use: 11 1 - Amount (size/oz): "One bong hit" 1 - Frequency: Daily 1 - Duration: Ongoing for years 1 - Last Use / Amount: 01/03/2021 1 - Method of Aquiring: Unknown 1- Route of Use: Smoking  Sleep: Good  Appetite:  Poor  Current Medications: Current Facility-Administered Medications  Medication Dose Route Frequency Provider Last Rate Last Admin   acetaminophen (TYLENOL) tablet 650 mg  650 mg Oral Q6H PRN Bobbitt, Shalon E, NP       alum & mag hydroxide-simeth (MAALOX/MYLANTA) 200-200-20 MG/5ML suspension 30 mL  30 mL Oral Q4H PRN Bobbitt, Shalon E, NP       citalopram (CELEXA) tablet 20 mg  20 mg Oral Daily Sharma Covert, MD   20 mg at 01/09/21 0834   feeding supplement (BOOST / RESOURCE BREEZE) liquid 1 Container  1 Container Oral Q24H Sharma Covert, MD   1 Container at 01/09/21 1150   feeding supplement (ENSURE ENLIVE / ENSURE PLUS) liquid 237 mL  237 mL Oral Q24H Sharma Covert, MD   237 mL at 01/08/21 2109   hydrOXYzine (ATARAX/VISTARIL) tablet 25 mg  25 mg Oral TID PRN Bobbitt, Hessie Diener E, NP   25 mg at 01/05/21 2116   multivitamin with minerals tablet 1 tablet  1 tablet Oral Daily Sharma Covert, MD   1 tablet at 01/09/21 1224   nicotine polacrilex (NICORETTE) gum 2 mg  2 mg Oral PRN Sharma Covert, MD       QUEtiapine (SEROQUEL) tablet 100 mg  100 mg Oral QHS Arthor Captain, MD   100 mg at 01/08/21 2109   Lab Results:  No results found for this or any previous visit (from the past 48 hour(s)).  Blood Alcohol level:  Lab Results  Component Value Date   [facility] <10 01/04/2021   ETH <11 82/50/0370   Metabolic Disorder Labs: Lab Results  Component Value Date   HGBA1C 5.5 01/04/2021   MPG 111 01/04/2021   No results found for: PROLACTIN Lab Results  Component Value Date   CHOL 128 01/04/2021   TRIG 80 01/04/2021   HDL 44 01/04/2021   CHOLHDL 2.9 01/04/2021   VLDL 16 01/04/2021   LDLCALC 68 01/04/2021    Physical Findings: AIMS: Facial and Oral Movements Muscles of Facial Expression: None, normal Lips and Perioral Area: None, normal Jaw: None, normal Tongue: None, normal,Extremity Movements Upper (arms, wrists, hands, fingers): None, normal Lower (legs, knees, ankles, toes): None, normal, Trunk Movements Neck, shoulders, hips: None, normal, Overall Severity Severity of abnormal movements (highest score from questions above): None, normal Incapacitation due to abnormal movements: None, normal Patient's awareness of abnormal movements (rate only patient's report): No Awareness, Dental Status Current problems with teeth and/or dentures?: No Does patient usually wear dentures?: No  CIWA:    COWS:     Musculoskeletal: Strength & Muscle Tone: within normal limits Gait & Station: normal Patient leans:  N/A  Psychiatric Specialty Exam:  Presentation  General Appearance: Casual  Eye Contact:Fair  Speech:Clear and Coherent; Normal Rate  Speech Volume:Normal  Handedness:Right  Mood and Affect  Mood:Depressed ("Less depressed.")  Affect:Congruent  Thought Process  Thought Processes:Coherent; Goal Directed  Descriptions of Associations:Circumstantial  Orientation:Full (Time, Place and Person)  Thought Content:Logical  History of Schizophrenia/Schizoaffective disorder:No  Duration of Psychotic Symptoms:No data recorded Hallucinations:No data recorded  Ideas of Reference:None  Suicidal Thoughts:No data recorded  Homicidal Thoughts:No data recorded  Sensorium  Memory:Immediate Fair; Recent Fair; Remote Fair  Judgment:Fair  Insight:Shallow  Executive Functions  Concentration:Fair  Attention Span:Fair  [place]  Language:Good  Psychomotor Activity  Psychomotor Activity:No data recorded  Assets  Assets:Communication Skills; Desire for Improvement; Resilience  Sleep  Sleep:No data recorded  Physical Exam: Physical  Exam Vitals and nursing note reviewed.  Constitutional:      General: He is not in acute distress. HENT:     Head: Normocephalic and atraumatic.     Nose: Nose normal.     Mouth/Throat:     Pharynx: Oropharynx is clear.  Eyes:     Pupils: Pupils are equal, round, and reactive to light.  Cardiovascular:     Rate and Rhythm: Normal rate.     Pulses: Normal pulses.  Pulmonary:     Effort: Pulmonary effort is normal.  Genitourinary:    Comments: Deferred Musculoskeletal:        General: Normal range of motion.     Cervical back: Normal range of motion.  Skin:    General: Skin is warm and dry.  Neurological:     General: No focal deficit present.     Mental Status: He is alert and oriented to person, place, and time.   Review of Systems  Constitutional: Negative.   HENT: Negative.    Eyes: Negative.   Respiratory: Negative.    Cardiovascular: Negative.   Gastrointestinal: Negative.   Genitourinary: Negative.   Musculoskeletal: Negative.   Skin: Negative.   Neurological: Negative.   Psychiatric/Behavioral:  Positive for depression, hallucinations and substance abuse (Hx THC). Negative for memory loss and suicidal ideas. The patient is nervous/anxious ("Improving"). The patient does not have insomnia.   Blood pressure (!) 125/91, pulse (!) 106, temperature 97.8 F (36.6 C), temperature source Oral, resp. rate 16, height 5' 9"  (1.753 m), weight 58.5 kg, SpO2 100 %. Body mass index is 19.05 kg/m.  Treatment Plan Summary: Daily contact with patient to assess and evaluate symptoms and progress in treatment and Medication management.  Continue inpatient hospitalization.  Will continue today 01/09/2021 plan as below except where it is noted.   Continue every 15-minute observation status. Encourage participation in group therapy and therapeutic milieu.  Depression -Continue Celexa 20 mg daily -Continue Seroquel100 mg at bedtime for adjunctive treatment of mood, anxiety and  sleep  Anxiety -Continue Celexa 20 mg daily -Continue hydroxyzine 76m TID PRN  Insomnia -Discontinued trazodone 25 mg at bedtime PRN, patient declined to take.  Low body weight -Continue Boost and Ensure supplements  Discharge planning in progress.  ALindell Spar NP, pmhnp, fnp-bc 01/09/2021, 3:06 PM

## 2021-01-09 NOTE — Progress Notes (Signed)
   01/09/21 1900  Psych Admission Type (Psych Patients Only)  Admission Status Voluntary  Psychosocial Assessment  Patient Complaints Anxiety  Eye Contact Fair  Facial Expression Animated  Affect Appropriate to circumstance  Speech Logical/coherent  Interaction Assertive  Motor Activity Fidgety  Appearance/Hygiene Unremarkable  Behavior Characteristics Cooperative;Appropriate to situation  Mood Pleasant  Thought Process  Coherency WDL  Content WDL  Delusions None reported or observed  Perception WDL  Hallucination None reported or observed  Judgment Poor  Confusion None  Danger to Self  Current suicidal ideation? Denies  Self-Injurious Behavior No self-injurious ideation or behavior indicators observed or expressed   Agreement Not to Harm Self Yes  Description of Agreement verbal contract  Danger to Others  Danger to Others None reported or observed

## 2021-01-09 NOTE — Progress Notes (Signed)
Recreation Therapy Notes  Date:  6.10.22 Time: 0930 Location: [address] Dayroom  Group Topic: Stress Management  Goal Area(s) Addresses:  Patient will identify positive stress management techniques. Patient will identify benefits of using stress management post d/c.  Behavioral Response: Attentive  Intervention: Stress Management  Activity:  Meditation.  LRT played a meditation that focused on calming anxiety by allowing the breathing to relax you and also in the moment clear the mind of any worry or things out your control.    Education:  Stress Management, Discharge Planning.   Education Outcome: Acknowledges Education  Clinical Observations/Feedback: Pt attended and participated in the activity.  Pt expressed feeling really calm and feeling quiet in his mind.     Caroll Rancher, LRT/CTRS         Caroll Rancher A 01/09/2021 10:55 AM

## 2021-01-09 NOTE — [facility] Suicide Risk Assessment (Signed)
[facility] INPATIENT:  Family/Significant Other Suicide Prevention Education  Suicide Prevention Education:  Education Completed; Chad Williamson (mother) [phone],  (name of family member/significant other) has been identified by the patient as the family member/significant other with whom the patient will be residing, and identified as the person(s) who will aid the patient in the event of a mental health crisis (suicidal ideations/suicide attempt).  With written consent from the patient, the family member/significant other has been provided the following suicide prevention education, prior to the and/or following the discharge of the patient.  The suicide prevention education provided includes the following: Suicide risk factors Suicide prevention and interventions National Suicide Hotline telephone number [place] [facility] assessment telephone number [facility] Emergency Assistance [facility] and/or Residential Mobile Crisis Unit telephone number  Request made of family/significant other to: Remove weapons (e.g., guns, rifles, knives), all items previously/currently identified as safety concern.   Remove drugs/medications (over-the-counter, prescriptions, illicit drugs), all items previously/currently identified as a safety concern.  The family member/significant other verbalizes understanding of the suicide prevention education information provided.  The family member/significant other agrees to remove the items of safety concern listed above.  "Years ago he was diagnosed with bipolar disorder and separation anxiety. His daughter's mother has been blocking him and she likes to mess with his mind. He started cutting his arms and chest and talking about wanting to die. He got off the phone with her the night he was admitted and was talking about running in front of a car and he said he needed help and I didn't know what to do so I called 911. I don't think he should be talking to  her while he is there but he gave her his code and the number.". Pt can return to mother's home at discharge. No weapons in the home. No safety concerns noted. Ms. Chad Williamson requested pt be discharged today, 01/09/21, so that he can attend his brother's graduation at 7:30pm. CSW agreed to share this request with the doctor. CSW discussed outpatient follow-up and importance to adhering to appointments.    Chad Williamson 01/09/2021, 11:34 AM

## 2021-01-10 MED ORDER — CITALOPRAM HYDROBROMIDE 20 MG PO TABS: 20.0000 mg | ORAL_TABLET | Freq: Every day | ORAL | 0 refills | Status: AC

## 2021-01-10 MED ORDER — QUETIAPINE FUMARATE 100 MG PO TABS: 100.0000 mg | ORAL_TABLET | Freq: Every day | ORAL | 0 refills | Status: AC

## 2021-01-10 MED ORDER — NICOTINE POLACRILEX 2 MG MT GUM: 2.0000 mg | CHEWING_GUM | OROMUCOSAL | 0 refills | Status: AC | PRN

## 2021-01-10 NOTE — [facility] Suicide Risk Assessment (Signed)
Chad Williamson   Principal Problem: MDD (major depressive disorder), recurrent severe, without psychosis (HCC) Discharge Diagnoses: Principal Problem:   MDD (major depressive disorder), recurrent severe, without psychosis (HCC)   Total Time spent with patient:  35 minutes Patient discussed with treatment team and seen.  Patient reports benefit from being the hospital, noting that he had been isolating self at home.  He has been attending groups and feels he has become comfortable with socialization.   He states that he has applied for several jobs, so that he can make money in order to travel to [place] to get his daughter (mother has asked for him to come pick her up). Patient is able to discuss coping mechanisms for managing anxiety and depressed mood. He states that Seroquel has eliminated his ruminating thoughts. He is compliant with medication without side effect.  He denies SI, thoughts of self harm, HI or AVH.  He does not have access to weapons. He is able to contract for safety at discharge.   Musculoskeletal: Strength & Muscle Tone: within normal limits Gait & Station: normal Patient leans: N/A  Psychiatric Specialty Exam  Presentation  General Appearance: Casual; Neat; Appropriate for Environment  Eye Contact:Good  Speech:Normal Rate  Speech Volume:Normal  Handedness:Right   Mood and Affect  Mood:Euthymic  Duration of Depression Symptoms: Greater than two weeks  Affect:Congruent; Full Range   Thought Process  Thought Processes:Coherent; Goal Directed  Descriptions of Associations:Intact  Orientation:Full (Time, Place and Person)  Thought Content:Logical  History of Schizophrenia/Schizoaffective disorder:No  Duration of Psychotic Symptoms:No data recorded Hallucinations:No data recorded Ideas of Reference:None  Suicidal Thoughts:Suicidal Thoughts: No Homicidal Thoughts:Homicidal Thoughts: No  Sensorium  Memory:Immediate  Fair; Recent Fair; Remote Fair  Judgment:Good  Insight:Good   Executive Functions  Concentration:Good  Attention Span:Good  Recall:Good  Fund of Knowledge:Good  Language:Good   Psychomotor Activity  Psychomotor Activity: Psychomotor Activity: Normal  Assets  Assets:Communication Skills; Desire for Improvement; Housing; Leisure Time; Physical Health; Resilience; Social Support; Talents/Skills; Transportation; Vocational/Educational; Other (comment) (seeking employment)   Sleep  Sleep: Sleep: Good Number of Hours of Sleep: 6.5  Physical Exam: Physical Exam Vitals and nursing note reviewed.  Constitutional:      Appearance: Normal appearance.     Comments: Thin  HENT:     Head: Normocephalic and atraumatic.  Eyes:     Extraocular Movements: Extraocular movements intact.  Cardiovascular:     Rate and Rhythm: Tachycardia present.  Pulmonary:     Effort: Pulmonary effort is normal. No respiratory distress.  Musculoskeletal:        General: Normal range of motion.     Cervical back: Normal range of motion.  Skin:    Comments: Superficial scratches and to chest and arms  Neurological:     General: No focal deficit present.     Mental Status: He is alert and oriented to person, place, and time.   Review of Systems  Constitutional: Negative.   Respiratory: Negative.    Cardiovascular: Negative.   Gastrointestinal: Negative.   Musculoskeletal: Negative.   Neurological: Negative.   Psychiatric/Behavioral:  Negative for depression, hallucinations, memory loss, substance abuse and suicidal ideas. The patient is not nervous/anxious and does not have insomnia.   Blood pressure (!) 124/93, pulse (!) 108, temperature 97.9 F (36.6 C), temperature source Oral, resp. rate 16, height 5\' 9"  (1.753 m), weight 58.5 kg, SpO2 100 %. Body mass index is 19.05 kg/m.  Mental Status Per Nursing Williamson::   On  Admission:  Suicidal ideation indicated by patient, Suicidal  ideation indicated by others, Self-harm thoughts, Self-harm behaviors  Demographic Factors:  Male, Adolescent or young adult, Caucasian, Low socioeconomic status, and Unemployed  Loss Factors: Break up with baby mama and 62 month old daughter are in [place]  Historical Factors: Prior suicide attempts and would be 2 year anniversary with his girlfriend  Risk Reduction Factors:   Responsible for children under 85 years of age, Sense of responsibility to family, Living with another person, especially a relative, Positive social support, Positive therapeutic relationship, Positive coping skills or problem solving skills, and plan to quit marijuana and nicotine   No access to weapons.   Continued Clinical Symptoms:  Depression  Cognitive Features That Contribute To Risk:  None    Suicide Risk:  Minimal: No identifiable suicidal ideation.  Patients presenting with no risk factors but with morbid ruminations; may be classified as minimal risk based on the severity of the depressive symptoms   Follow-up Information     [facility], [facility]. Go on 01/14/2021.   Why: You have a hospital follow up appointment for therapy and medication management services on 01/14/21 at 9:00 am.  This appointment will be held in person. Contact information: [address] [place] [address] [phone]                 Plan Of Care/Follow-up recommendations:  Activity:  ad lib Diet:  as tolerated  On day of discharge following sustained improvement in the affect of this patient, continued report of euthymic mood, repeated denial of suicidal, homicidal, and other violent ideation, adequate interaction with peers, active participation in groups while on the unit, and denial of adverse reactions from medications, the treatment team decided Chad Williamson was stable for discharge home with scheduled mental health treatment as noted below.  He was able to engage in safety planning  including plan to return to [facility] or contact emergency services if he feels unable to maintain his own safety or the safety of others. Patient had no further questions, comments, or concerns. Discharge into care of his mother, [name], who agrees to maintain patient safety.  Patient aware to return to nearest crisis center, ED or to call 911 for worsening symptoms of depression, suicidal or homicidal thoughts or AVH.   Mariel Craft, MD 01/10/2021, 11:26 AM

## 2021-01-10 NOTE — Progress Notes (Signed)
  [facility] Adult Case Management Discharge Plan :  Will you be returning to the same living situation after discharge:  Yes,  with family At discharge, do you have transportation home?: Yes,  arranged by patient Do you have the ability to pay for your medications: No.  Will need assistance from community agency  Release of information consent forms completed and emailed to Medical Records, then turned in to Medical Records by CSW.   Patient to Follow up at:  Follow-up Information     [facility]. Go on 01/14/2021.   Why: You have a hospital follow up appointment for therapy and medication management services on 01/14/21 at 9:00 am.  This appointment will be held in person. Contact information: [address] [place] [address] [phone]                 Next level of care provider has access to [facility] Link:no  Safety Planning and Suicide Prevention discussed: Yes,  with mother  Have you used any form of tobacco in the last 30 days? (Cigarettes, Smokeless Tobacco, Cigars, and/or Pipes): Yes  Has patient been referred to the Quitline?: Patient refused referral  Patient has been referred for addiction treatment: N/A  Lynnell Chad, LCSW 01/10/2021, 11:38 AM

## 2021-01-10 NOTE — Progress Notes (Signed)
Adult Psychoeducational Group Note  Date:  01/10/2021 Time:  10:24 AM  Group Topic/Focus:  Goals Group:   The focus of this group is to help patients establish daily goals to achieve during treatment and discuss how the patient can incorporate goal setting into their daily lives to aide in recovery.  Participation Level:  Active  Participation Quality:  Appropriate and Attentive  Affect:  Anxious and Appropriate  Cognitive:  Alert and Appropriate  Insight: Appropriate and Good  Engagement in Group:  Engaged  Modes of Intervention:  Discussion  Additional Comments:  Pt has a goal of initiating the discharge process and continuing to work on himself.   Deforest Hoyles Catherin Doorn 01/10/2021, 10:24 AM

## 2021-01-10 NOTE — Progress Notes (Signed)
Discharge Note:  Patient denies SI/HI at this time. Discharge instructions, AVS, prescriptions gone over with patient. Patient agrees to comply with medication management, follow-up visit, and outpatient therapy. Patient's questions and concerns addressed and answered. Patient discharged to home with family member.

## 2021-01-10 NOTE — Discharge Summary (Signed)
Physician Discharge Summary Note  Patient:  Chad Williamson is an 20 y.o., male MRN:  [id number] DOB:  April 13, 2001 Patient phone:  [phone] (home)  Patient address:   [address] [place] [address],  Total Time spent with patient: 30 minutes  Date of Admission:  01/04/2021 Date of Discharge: 01/10/2021  Reason for Admission:  (From MD's admission note): Patient is a 20 year old male who presented as a walk-in assessment to the behavioral health hospital on 01/04/2021.  The patient was accompanied by his mother.  She did not participate in the evaluation.  The patient reported that he had had depression since childhood, and his symptoms had worsened over the last 2 weeks.  He stated at that time his mental state was not good, and had numerous superficial self-inflicted lacerations on his left arm.  He admitted that he had been cutting himself since age 40, and he cut himself 2 days ago with scissors.  He reported suicidal ideation to walk into traffic.  He acknowledged that he had had suicide attempts as a child.  He had overdosed in 2014, but on my discussion with him today stated "it was only a couple of pills".  He stated that his most recent stressors was that he was living with his significant other and their child.  The child was just several months old.  He reported that he was angry, hostile and got into arguments easily.  It got to the point where the male asked him to leave the home.  He went out and spent the night in a car in front of the house, and was hoping that the conflict would be resolved, but the significant other would not allow him to return.  He then went to his mother's and stayed there for a couple of days, but "that did not work out either".  He then stated he went to stay with his grandmother, but he did not feel any better there and left.  He stated he was not working currently.  He stated that as a child he was treated at the [facility] facility, and had been on  medications, but did not believe that they were effective.  He stated that he had a family history of schizophrenia in his father, but denied any auditory or visual hallucinations.  He was admitted to the hospital for evaluation and stabilization.  Principal Problem: MDD (major depressive disorder), recurrent severe, without psychosis (HCC) Discharge Diagnoses: Principal Problem:   MDD (major depressive disorder), recurrent severe, without psychosis (HCC)   Past Psychiatric History: See H&P  Past Medical History:  Past Medical History:  Diagnosis Date   Depression    History reviewed. No pertinent surgical history. Family History:  Family History  Problem Relation Age of Onset   Asthma Father    Family Psychiatric  History: See H&P Social History:  Social History   Substance and Sexual Activity  Alcohol Use No     Social History   Substance and Sexual Activity  Drug Use Yes   Types: Marijuana   Comment: last use 09/26/14    Social History   Socioeconomic History   Marital status: Single    Spouse name: Not on file   Number of children: Not on file   Years of education: Not on file   Highest education level: Not on file  Occupational History   Not on file  Tobacco Use   Smoking status: Never   Smokeless tobacco: Never  Vaping Use  Vaping Use: Every day  Substance and Sexual Activity   Alcohol use: No   Drug use: Yes    Types: Marijuana    Comment: last use 09/26/14   Sexual activity: Not on file  Other Topics Concern   Not on file  Social History Narrative   Not on file   Social Determinants of Health   Financial Resource Strain: Not on file  Food Insecurity: Not on file  Transportation Needs: Not on file  Physical Activity: Not on file  Stress: Not on file  Social Connections: Not on file    Hospital Course:  After the above admission evaluation, Merced's presenting symptoms were noted. He was recommended for mood stabilization treatments. The  medication regimen targeting those presenting symptoms were discussed with him & initiated with his consent. He was started on Celexa and Seroquel for his depression. His UDS on arrival to the ED was positive for THC, BAL negative.  He was however medicated, stabilized & discharged on the medications as listed on his discharge medication list below. Besides the mood stabilization treatments, Jayvian was also enrolled & participated in the group counseling sessions being offered & held on this unit. He learned coping skills. He presented no other significant pre-existing medical issues that required treatment. He tolerated his treatment regimen without any adverse effects or reactions reported.   During the course of his hospitalization, the 15-minute checks were adequate to ensure patient's safety. Letrell did not display any dangerous, violent or suicidal behavior on the unit.  He interacted with patients & staff appropriately, participated appropriately in the group sessions/therapies. His medications were addressed & adjusted to meet his needs. He was recommended for outpatient follow-up care & medication management upon discharge to assure continuity of care & mood stability.  At the time of discharge patient is not reporting any acute suicidal/homicidal ideations. He feels more confident about his self-care & in managing his mental health. He currently denies any new issues or concerns. Education and supportive counseling provided throughout his hospital stay & upon discharge.   Today upon his discharge evaluation with the attending psychiatrist, Denzell shares he is doing well and feels stable for discharge. He denies any other specific concerns. He is sleeping well. His appetite is good. He denies other physical complaints. He denies AH/VH, delusional thoughts or paranoia. He does not appear to be responding to any internal stimuli. He feels that his medications have been helpful & is in agreement to continue his  current treatment regimen as recommended. He was able to engage in safety planning including plan to return to [facility] or contact emergency services if he feels unable to maintain his own safety or the safety of others. Pt had no further questions, comments, or concerns. He left [facility] with all personal belongings in no apparent distress. Transportation arranged by patient.    Physical Findings: AIMS: Facial and Oral Movements Muscles of Facial Expression: None, normal Lips and Perioral Area: None, normal Jaw: None, normal Tongue: None, normal,Extremity Movements Upper (arms, wrists, hands, fingers): None, normal Lower (legs, knees, ankles, toes): None, normal, Trunk Movements Neck, shoulders, hips: None, normal, Overall Severity Severity of abnormal movements (highest score from questions above): None, normal Incapacitation due to abnormal movements: None, normal Patient's awareness of abnormal movements (rate only patient's report): No Awareness, Dental Status Current problems with teeth and/or dentures?: No Does patient usually wear dentures?: No  CIWA:    COWS:     Musculoskeletal: Strength & Muscle Tone: within normal  limits Gait & Station: normal Patient leans: N/A   Psychiatric Specialty Exam:  Presentation  General Appearance: Casual; Appropriate for Environment  Eye Contact:Good  Speech:Clear and Coherent; Normal Rate  Speech Volume:Normal  Handedness:Right   Mood and Affect  Mood:Euthymic ("Less depressed.")  Affect:Congruent   Thought Process  Thought Processes:Coherent; Goal Directed  Descriptions of Associations:Circumstantial  Orientation:Full (Time, Place and Person)  Thought Content:Logical  History of Schizophrenia/Schizoaffective disorder:No  Duration of Psychotic Symptoms:No data recorded Hallucinations:No data recorded Ideas of Reference:None  Suicidal Thoughts:No data recorded Homicidal Thoughts:No data recorded  Sensorium   Memory:Immediate Fair; Recent Fair; Remote Fair  Judgment:Fair  Insight:Fair   Executive Functions  Concentration:Fair  Attention Span:Fair  Recall:Fair  Fund of Knowledge:Fair  Language:Good   Psychomotor Activity  Psychomotor Activity: No data recorded  Assets  Assets:Communication Skills; Desire for Improvement; Resilience; Housing; Social Support   Sleep  Sleep: No data recorded   Physical Exam: Physical Exam Vitals and nursing note reviewed.  Constitutional:      Appearance: Normal appearance.  HENT:     Head: Normocephalic.  Pulmonary:     Effort: Pulmonary effort is normal.  Musculoskeletal:        General: Normal range of motion.     Cervical back: Normal range of motion.  Neurological:     General: No focal deficit present.     Mental Status: He is alert and oriented to person, place, and time.  Psychiatric:        Attention and Perception: Attention normal. He does not perceive auditory or visual hallucinations.        Mood and Affect: Mood normal.        Speech: Speech normal.        Behavior: Behavior normal. Behavior is cooperative.        Thought Content: Thought content normal. Thought content is not paranoid or delusional. Thought content does not include homicidal or suicidal ideation. Thought content does not include homicidal or suicidal plan.        Cognition and Memory: Cognition normal.   Review of Systems  Constitutional:  Negative for fever.  HENT:  Negative for congestion, sinus pain and sore throat.   Respiratory:  Negative for cough and shortness of breath.   Cardiovascular: Negative.   Gastrointestinal: Negative.   Genitourinary: Negative.   Musculoskeletal: Negative.   Neurological: Negative.   Blood pressure (!) 124/93, pulse (!) 108, temperature 97.9 F (36.6 C), temperature source Oral, resp. rate 16, height 5\' 9"  (1.753 m), weight 58.5 kg, SpO2 100 %. Body mass index is 19.05 kg/m.   Social History   Tobacco Use   Smoking Status Never  Smokeless Tobacco Never   Tobacco Cessation:  A prescription for an FDA-approved tobacco cessation medication provided at discharge   Blood Alcohol level:  Lab Results  Component Value Date   [facility] <10 01/04/2021   ETH <11 01/31/2013    Metabolic Disorder Labs:  Lab Results  Component Value Date   HGBA1C 5.5 01/04/2021   MPG 111 01/04/2021   No results found for: PROLACTIN Lab Results  Component Value Date   CHOL 128 01/04/2021   TRIG 80 01/04/2021   HDL 44 01/04/2021   CHOLHDL 2.9 01/04/2021   VLDL 16 01/04/2021   LDLCALC 68 01/04/2021    See Psychiatric Specialty Exam and Suicide Risk Assessment completed by Attending Physician prior to discharge.  Discharge destination:  Home  Is patient on multiple antipsychotic therapies at discharge:  No  Has Patient had three or more failed trials of antipsychotic monotherapy by history:  No  Recommended Plan for Multiple Antipsychotic Therapies: NA   Allergies as of 01/10/2021       Reactions   Shellfish Allergy Anaphylaxis, Rash   Latex Rash, Other (See Comments)   Redness        Medication List     TAKE these medications      Indication  citalopram 20 MG tablet Commonly known as: CELEXA Take 1 tablet (20 mg total) by mouth daily. Start taking on: January 11, 2021  Indication: Depression, Generalized Anxiety Disorder   nicotine polacrilex 2 MG gum Commonly known as: NICORETTE Take 1 each (2 mg total) by mouth as needed for smoking cessation.  Indication: Nicotine Addiction   QUEtiapine 100 MG tablet Commonly known as: SEROQUEL Take 1 tablet (100 mg total) by mouth at bedtime.  Indication: Generalized Anxiety Disorder, Major Depressive Disorder        Follow-up Information     [facility]. Go on 01/14/2021.   Why: You have a hospital follow up appointment for therapy and medication management services on 01/14/21 at 9:00 am.  This appointment will be held in  person. Contact information: [address] [place] [id number] [phone]                 Follow-up recommendations:  Activity:  as tolerated Diet:  Heart healthy  Comments:  Prescriptions were given at discharge.  Patient is agreeable with the discharge plan.  He was given opportunity to ask questions.  He appears to feel comfortable with discharge and denies any current suicidal or homicidal thoughts.   Patient is instructed prior to discharge to: Take all medications as prescribed by his mental healthcare provider. Report any adverse effects and or reactions from the medicines to his outpatient provider promptly. Patient has been instructed & cautioned: To not engage in alcohol and or illegal drug use while on prescription medicines. In the event of worsening symptoms, patient is instructed to call the crisis hotline, 911 and or go to the nearest ED for appropriate evaluation and treatment of symptoms. To follow-up with hisprimary care provider for your other medical issues, concerns and or health care needs.   Signed: Laveda AbbeLaurie Britton Raquan Iannone, NP 01/10/2021, 10:32 AM

## 2021-01-10 NOTE — [facility] Group Notes (Signed)
LCSW Group Therapy Note  01/10/2021   10:00-11:00am   Type of Therapy and Topic:  Group Therapy: Anger Cues and Responses  Participation Level:  Active   Description of Group:   In this group, patients learned how to recognize the physical, cognitive, emotional, and behavioral responses they have to anger-provoking situations.  They identified a recent time they became angry and how they reacted.  They analyzed how their reaction was possibly beneficial and how it was possibly unhelpful.  The group discussed a variety of healthier coping skills that could help with such a situation in the future.  Focus was placed on how helpful it is to recognize the underlying emotions to our anger, because working on those can lead to a more permanent solution as well as our ability to focus on the important rather than the urgent.  Therapeutic Goals: Patients will remember their last incident of anger and how they felt emotionally and physically, what their thoughts were at the time, and how they behaved. Patients will identify how their behavior at that time worked for them, as well as how it worked against them. Patients will explore possible new behaviors to use in future anger situations. Patients will learn that anger itself is normal and cannot be eliminated, and that healthier reactions can assist with resolving conflict rather than worsening situations.  Summary of Patient Progress:  The patient was late to group because he was with a provider.  He talked frequently throughout group, sharing his ideas.  He has developed considerable insight.  Therapeutic Modalities:   Cognitive Behavioral Therapy  Lynnell Chad

## 2021-08-09 ENCOUNTER — Emergency Department (HOSPITAL_COMMUNITY)
Admission: EM | Admit: 2021-08-09 | Discharge: 2021-08-09 | Disposition: A | Discharge status: Home / Self Care | Payer: Medicaid Other | Attending: Emergency Medicine | Admitting: Emergency Medicine

## 2021-08-09 ENCOUNTER — Encounter (HOSPITAL_COMMUNITY): Payer: Self-pay

## 2021-08-09 ENCOUNTER — Other Ambulatory Visit: Payer: Self-pay

## 2021-08-09 DIAGNOSIS — F331 Major depressive disorder, recurrent, moderate: Secondary | ICD-10-CM | POA: Insufficient documentation

## 2021-08-09 DIAGNOSIS — R45851 Suicidal ideations: Secondary | ICD-10-CM | POA: Insufficient documentation

## 2021-08-09 DIAGNOSIS — Z20822 Contact with and (suspected) exposure to covid-19: Secondary | ICD-10-CM | POA: Insufficient documentation

## 2021-08-09 DIAGNOSIS — Z91040 Latex allergy status: Secondary | ICD-10-CM | POA: Insufficient documentation

## 2021-08-09 DIAGNOSIS — F32A Depression, unspecified: Secondary | ICD-10-CM

## 2021-08-09 DIAGNOSIS — Z046 Encounter for general psychiatric examination, requested by authority: Secondary | ICD-10-CM | POA: Insufficient documentation

## 2021-08-09 LAB — CBC WITH DIFFERENTIAL/PLATELET
Abs Immature Granulocytes: 0.02 10*3/uL (ref 0.00–0.07)
Basophils Absolute: 0.1 10*3/uL (ref 0.0–0.1)
Basophils Relative: 1 %
Eosinophils Absolute: 0.2 10*3/uL (ref 0.0–0.5)
Eosinophils Relative: 3 %
HCT: 40.3 % (ref 39.0–52.0)
Hemoglobin: 13.6 g/dL (ref 13.0–17.0)
Immature Granulocytes: 0 %
Lymphocytes Relative: 26 %
Lymphs Abs: 2.1 10*3/uL (ref 0.7–4.0)
MCH: 31.3 pg (ref 26.0–34.0)
MCHC: 33.7 g/dL (ref 30.0–36.0)
MCV: 92.6 fL (ref 80.0–100.0)
Monocytes Absolute: 0.7 10*3/uL (ref 0.1–1.0)
Monocytes Relative: 9 %
Neutro Abs: 5.1 10*3/uL (ref 1.7–7.7)
Neutrophils Relative %: 61 %
Platelets: 205 10*3/uL (ref 150–400)
RBC: 4.35 MIL/uL (ref 4.22–5.81)
RDW: 12.3 % (ref 11.5–15.5)
WBC: 8.2 10*3/uL (ref 4.0–10.5)
nRBC: 0 % (ref 0.0–0.2)

## 2021-08-09 LAB — BASIC METABOLIC PANEL
Anion gap: 5 (ref 5–15)
BUN: 14 mg/dL (ref 6–20)
CO2: 31 mmol/L (ref 22–32)
Calcium: 9.2 mg/dL (ref 8.9–10.3)
Chloride: 104 mmol/L (ref 98–111)
Creatinine, Ser: 0.93 mg/dL (ref 0.61–1.24)
GFR, Estimated: >60 mL/min (ref >60)
Glucose, Bld: 87 mg/dL (ref 70–99)
Potassium: 4.3 mmol/L (ref 3.5–5.1)
Sodium: 140 mmol/L (ref 135–145)

## 2021-08-09 LAB — RAPID URINE DRUG SCREEN, HOSP PERFORMED
Amphetamines: NOT DETECTED
Barbiturates: NOT DETECTED
Benzodiazepines: NOT DETECTED
Cocaine: NOT DETECTED
Opiates: NOT DETECTED
Tetrahydrocannabinol: POSITIVE — AB

## 2021-08-09 LAB — RESP PANEL BY RT-PCR (FLU A&B, COVID) ARPGX2
Influenza A by PCR: NEGATIVE
Influenza B by PCR: NEGATIVE
SARS Coronavirus 2 by RT PCR: NEGATIVE

## 2021-08-09 LAB — ETHANOL: Alcohol, Ethyl (B): <10 mg/dL (ref <10)

## 2021-08-09 NOTE — BH Assessment (Signed)
Comprehensive Clinical Assessment (CCA) Note  08/09/2021 DIMAGGIO WINEY [id number]  Disposition:  Gave clinical disposition to B. Leevy-Buser, NP, who determined that Pt may be discharged with outpatient resources.    The patient demonstrates the following risk factors for suicide: Chronic risk factors for suicide include: psychiatric disorder of MDD, previous suicide attempts 1, and demographic factors (male, >23 y/o). Acute risk factors for suicide include: social withdrawal/isolation. Protective factors for this patient include: positive social support. Considering these factors, the overall suicide risk at this point appears to be low. Patient is appropriate for outpatient follow up.   [place] ED from 08/09/2021 in [place] Admission (Discharged) from OP Visit from 01/04/2021 in [place] [facility]  C-SSRS RISK CATEGORY Low Risk High Risk       Chief Complaint:  Chief Complaint  Patient presents with   V70.1   Depression    Pt reported that he has experienced depressive symptoms since Thanksgiving -- isolation, poor sleep, hopelessness, passive SI.   Visit Diagnosis: Major Depressive Disorder, Recurrent, Moderate  Narrative:  Pt is a  21 year old male who presented to [place] on a voluntary basis with complaint of depressive symptoms, including passive suicidal ideation.  Pt lives in [place] with his mother, and he is employed by [employer].  Pt was assessed and treated at [facility] for depressive symptoms/suicidal ideation in June 2022.    Pt reported that he has been without prescribed anti-depressant medication for several months.  He was prescribed medication when discharged from [facility], and he does not have it because he has left the medication with at the home of his daughter's mother.  Pt reported that he has been through several stressors recently -- he argued with his girlfriend, he feels work stress, and he is concerned about his daughter.   Pt endorsed passive suicidal ideation, at least one past suicide attempt by overdose, poor sleep, despondency, isolation, feelings of hopelessness and worthlessness.  Pt also endorsed episodic marijuana use.  UDS was not available at time of assessment.  Pt denied active suicidal ideation, hallucination, homicidal ideation, and self-injurious behavior.  Author asked client what help he wanted today.  Pt stated that he is seeking medication and also to stay because ''I've got a lot of thoughts I have to sort through.'' Asked client if he would be safe should he be discharged.  He was ambivalent.  During assessment, Pt presented as alert and oriented.  He had good eye contact and was cooperative.  Pt was appropriately groomed.  Pt's mood was depressed, and affect was blunted.  Pt's speech was normal in rate, rhythm, and volume.  Thought processes were within normal range, and thought content was logical and goal-oriented.  There was no evidence of delusion.  Memory and concentration were intact.  Insight, judgment, and impulse control were fair.   CCA Screening, Triage and Referral (STR)  Patient Reported Information How did you hear about [place]? Self  What Is the Reason for Your Visit/Call Today? Depressive symptoms, passive suiidal ideation  How Long Has This Been Causing You Problems? 1-6 months  What Do You Feel Would Help You the Most Today? Treatment for Depression or other mood problem; Medication(s)   Have You Recently Had Any Thoughts About Hurting Yourself? Yes  Are You Planning to Commit Suicide/Harm Yourself At This time? No   Have you Recently Had Thoughts About [place]? No  Are You Planning to Harm Someone at This  Time? No  Explanation: No data recorded  Have You Used Any Alcohol or Drugs in the Past 24 Hours? Yes  How Long Ago Did You Use Drugs or Alcohol? No data recorded What Did You Use and How Much? Marijuana   Do You Currently Have a  Therapist/Psychiatrist? No  Name of Therapist/Psychiatrist: No data recorded  Have You Been Recently Discharged From Any Office Practice or Programs? No  Explanation of Discharge From Practice/Program: No data recorded    CCA Screening Triage Referral Assessment Type of Contact: Tele-Assessment  Telemedicine Service Delivery: Telemedicine service delivery: This service was provided via telemedicine using a 2-way, interactive audio and video technology  Is this Initial or Reassessment? Initial Assessment  Date Telepsych consult ordered in [facility]:  08/09/21  Time Telepsych consult ordered in [facility]:  No data recorded Location of Assessment: [facility]  Provider Location: [facility]   Collateral Involvement: Mother: Gae Dry [phone]   Does Patient Have a [place]? No data recorded Name and Contact of Legal Guardian: No data recorded If Minor and Not Living with Parent(s), Who has Custody? NA  Is CPS involved or ever been involved? Never  Is APS involved or ever been involved? Never   Patient Determined To Be At Risk for Harm To Self or Others Based on Review of Patient Reported Information or Presenting Complaint? Yes, for Self-Harm  Method: No data recorded Availability of Means: No data recorded Intent: No data recorded Notification Required: No data recorded Additional Information for Danger to Others Potential: No data recorded Additional Comments for Danger to Others Potential: No data recorded Are There Guns or Other Weapons in Your Home? No data recorded Types of Guns/Weapons: No data recorded Are These Weapons Safely Secured?                            No data recorded Who Could Verify You Are Able To Have These Secured: No data recorded Do You Have any Outstanding Charges, Pending Court Dates, Parole/Probation? No data recorded Contacted To Inform of Risk of Harm To Self or Others: Family/Significant Other:    Does Patient  Present under Involuntary Commitment? No  IVC Papers Initial File Date: No data recorded  [place] of Residence: [place]   Patient Currently Receiving the Following Services: Not Receiving Services   Determination of Need: Urgent (48 hours)   Options For Referral: Inpatient Hospitalization; Outpatient Therapy; Medication Management     CCA Biopsychosocial Patient Reported Schizophrenia/Schizoaffective Diagnosis in Past: No   Strengths: Pt is motivated for treatment.   Mental Health Symptoms Depression:   Change in energy/activity; Difficulty Concentrating; Fatigue; Sleep (too much or little); Tearfulness; Worthlessness; Hopelessness   Duration of Depressive symptoms:  Duration of Depressive Symptoms: Greater than two weeks   Mania:   None   Anxiety:    Restlessness; Sleep; Tension   Psychosis:   None   Duration of Psychotic symptoms:    Trauma:   None   Obsessions:   None   Compulsions:   None   Inattention:   None   Hyperactivity/Impulsivity:   None   Oppositional/Defiant Behaviors:   None   Emotional Irregularity:   None   Other Mood/Personality Symptoms:   NA    Mental Status Exam Appearance and self-care  Stature:   Average   Weight:   Average weight   Clothing:   Casual   Grooming:   Normal   Cosmetic  use:   None   Posture/gait:   Normal   Motor activity:   Not Remarkable   Sensorium  Attention:   Normal   Concentration:   Normal   Orientation:   X5   Recall/memory:   Normal   Affect and Mood  Affect:   Blunted   Mood:   Depressed   Relating  Eye contact:   Normal   Facial expression:   Responsive   Attitude toward examiner:   Cooperative   Thought and Language  Speech flow:  Normal   Thought content:   Appropriate to Mood and Circumstances   Preoccupation:   None   Hallucinations:   None   Organization:  No data recorded  [employer] of Knowledge:   Average    Intelligence:   Average   Abstraction:   Normal   Judgement:   Fair   Art therapist:   Realistic   Insight:   Fair   Decision Making:   Normal   Social Functioning  Social Maturity:   Isolates   Social Judgement:   Normal   Stress  Stressors:   Relationship; Financial; Work   Coping Ability:   Advice worker Deficits:   None   Supports:   Family     Religion: Religion/Spirituality Are You A Religious Person?: No  Leisure/Recreation: Leisure / Recreation Do You Have Hobbies?: No  Exercise/Diet: Exercise/Diet Do You Exercise?: No Have You Gained or Lost A Significant Amount of Weight in the Past Six Months?: No Do You Follow a Special Diet?: No Do You Have Any Trouble Sleeping?: Yes Explanation of Sleeping Difficulties: Pt endorsed disturbed sleep   CCA Employment/Education Employment/Work Situation: Employment / Work Situation Employment Situation: Employed Has Patient ever Been in Passenger transport manager?: No  Education: Education Is Patient Currently Attending School?: No Last Grade Completed: 10 Did You Nutritional therapist?: Yes What Type of College Degree Do you Have?: Some classes at [employer] after earning GED Did You Have An Individualized Education Program (IIEP): No Did You Have Any Difficulty At School?: Yes Were Any Medications Ever Prescribed For These Difficulties?: Yes Medications Prescribed For School Difficulties?: ADHD meds Patient's Education Has Been Impacted by Current Illness: No   CCA Family/Childhood History Family and Relationship History: Family history Marital status: Single Does patient have children?: Yes How many children?: 1 How is patient's relationship with their children?: daughter- still communicates with her  Childhood History:  Childhood History By whom was/is the patient raised?: Mother Did patient suffer any verbal/emotional/physical/sexual abuse as a child?: No Did patient suffer from severe childhood  neglect?: No Has patient ever been sexually abused/assaulted/raped as an adolescent or adult?: No Was the patient ever a victim of a crime or a disaster?: No Witnessed domestic violence?: Yes Has patient been affected by domestic violence as an adult?: No  Child/Adolescent Assessment:     CCA Substance Use Alcohol/Drug Use: Alcohol / Drug Use Pain Medications: Please see MAR Prescriptions: Please see MAR Over the Counter: Please see MAR History of alcohol / drug use?: Yes Longest period of sobriety (when/how long): Unknown Substance #1 Name of Substance 1: Marijuana 1 - Amount (size/oz): Varied 1 - Frequency: Episodic 1 - Duration: Ongoing 1 - Last Use / Amount: Pt is not sure; UDS was not available at time of assessment.                       ASAM's:  Six Dimensions of  Multidimensional Assessment  Dimension 1:  Acute Intoxication and/or Withdrawal Potential:   Dimension 1:  Description of individual's past and current experiences of substance use and withdrawal: Pt denies withdrawal symptoms  Dimension 2:  Biomedical Conditions and Complications:   Dimension 2:  Description of patient's biomedical conditions and  complications: No medical problems  Dimension 3:  Emotional, Behavioral, or Cognitive Conditions and Complications:  Dimension 3:  Description of emotional, behavioral, or cognitive conditions and complications: Pt reports depressive symptoms  Dimension 4:  Readiness to Change:  Dimension 4:  Description of Readiness to Change criteria: Contemplation stage  Dimension 5:  Relapse, Continued use, or Continued Problem Potential:  Dimension 5:  Relapse, continued use, or continued problem potential critiera description: Pt has not attempted to stop using marijuana  Dimension 6:  Recovery/Living Environment:  Dimension 6:  Recovery/Iiving environment criteria description: Living with mother, supportive mlieu  ASAM Severity Score:    ASAM Recommended Level of  Treatment:     Substance use Disorder (SUD) Substance Use Disorder (SUD)  Checklist Symptoms of Substance Use: Continued use despite having a persistent/recurrent physical/psychological problem caused/exacerbated by use  Recommendations for Services/Supports/Treatments: Recommendations for Services/Supports/Treatments Recommendations For Services/Supports/Treatments: Individual Therapy  Discharge Disposition:    DSM5 Diagnoses: Patient Active Problem List   Diagnosis Date Noted   MDD (major depressive disorder), recurrent severe, without psychosis ([place]) 01/04/2021   Right hand fracture 03/27/2013     Referrals to Alternative Service(s): Referred to Alternative Service(s):   Place:   Date:   Time:    Referred to Alternative Service(s):   Place:   Date:   Time:    Referred to Alternative Service(s):   Place:   Date:   Time:    Referred to Alternative Service(s):   Place:   Date:   Time:     Marlowe Aschoff, [facility]

## 2021-08-09 NOTE — Discharge Instructions (Signed)
Lab work is all reassuring.  Please count with all home medications.  Please follow-up with the recommendations provided to you by the psychiatrist team.  Come back to the emergency department if you develop chest pain, shortness of breath, severe abdominal pain, uncontrolled nausea, vomiting, diarrhea.

## 2021-08-09 NOTE — ED Provider Notes (Signed)
[facility] EMERGENCY DEPARTMENT Provider Note   CSN: [id number] Arrival date & time: 08/09/21  0930     History  Chief Complaint  Patient presents with   V70.1   Depression    Pt reported that he has experienced depressive symptoms since Thanksgiving -- isolation, poor sleep, hopelessness, passive SI.    Chad Williamson is a 21 y.o. male.  HPI  Patient with history including depression, suicidal ideations presents emergency department with chief complaint of feeling depressed.  He comes via local police department.  He states that over the last couple months he has been feeling more down, states that he has a lot of stressors in life, family, paying bills, new job and he has been trying to push them off but things have come to ahead.  He does not endorse suicidal or homicidal ideations he does state that he has been hearing voices but this has been normal for him, they do not tell him to do anything. They do not keeping him up at night, he endorses that he has been smoking marijuana and using nicotine products.  he was on antipsychotic medications but unfortunately has run out of them, he is not sure of what he was taking.  He states that he has been trying the coping mechanism that he learned during his last behavioral health stay but states he is unable to manage this at this time.  He has no other complaints.  Does not endorse fevers, chills, chest pain, shortness of breath, general body aches.  After reviewing patient's charts patient was recently admitted to the behavioral health in June of last year, and was later discharged, it appears that he was started on Seroquel as well as Celexa.  Home Medications Prior to Admission medications   Medication Sig Start Date End Date Taking? Authorizing Provider  citalopram (CELEXA) 20 MG tablet Take 1 tablet (20 mg total) by mouth daily. 01/11/21   Laveda Abbe, NP  nicotine polacrilex (NICORETTE) 2 MG gum Take 1 each (2 mg total) by  mouth as needed for smoking cessation. 01/10/21   Laveda Abbe, NP  QUEtiapine (SEROQUEL) 100 MG tablet Take 1 tablet (100 mg total) by mouth at bedtime. 01/10/21   Laveda Abbe, NP      Allergies    Penicillins, Shellfish allergy, and Latex    Review of Systems   Review of Systems  Constitutional:  Negative for chills and fever.  HENT:  Negative for congestion.   Respiratory:  Negative for shortness of breath.   Cardiovascular:  Negative for chest pain.  Gastrointestinal:  Negative for abdominal pain.  Genitourinary:  Negative for enuresis.  Musculoskeletal:  Negative for back pain.  Skin:  Negative for rash.  Neurological:  Negative for dizziness.  Hematological:  Does not bruise/bleed easily.  Psychiatric/Behavioral:  Negative for agitation, self-injury and suicidal ideas. The patient is not nervous/anxious.    Physical Exam Updated Vital Signs BP 104/62 (BP Location: Right Arm)    Pulse 78    Temp 98.1 F (36.7 C) (Oral)    Resp 18    Ht 5\' 9"  (1.753 m)    SpO2 100%    BMI 19.05 kg/m  Physical Exam Vitals and nursing note reviewed.  Constitutional:      General: He is not in acute distress.    Appearance: He is not ill-appearing.  HENT:     Head: Normocephalic and atraumatic.     Nose: No congestion.  Eyes:  Conjunctiva/sclera: Conjunctivae normal.  Cardiovascular:     Rate and Rhythm: Normal rate and regular rhythm.     Pulses: Normal pulses.     Heart sounds: No murmur heard.   No friction rub. No gallop.  Pulmonary:     Effort: No respiratory distress.     Breath sounds: No wheezing, rhonchi or rales.  Skin:    General: Skin is warm and dry.  Neurological:     Mental Status: He is alert.  Psychiatric:        Mood and Affect: Mood normal.     Comments: Responding appropriately, does not appear to be responding to internal stimuli.    ED Results / Procedures / Treatments   Labs (all labs ordered are listed, but only abnormal results are  displayed) Labs Reviewed  RAPID URINE DRUG SCREEN, HOSP PERFORMED - Abnormal; Notable for the following components:      Result Value   Tetrahydrocannabinol POSITIVE (*)    All other components within normal limits  RESP PANEL BY RT-PCR (FLU A&B, COVID) ARPGX2  ETHANOL  CBC WITH DIFFERENTIAL/PLATELET  BASIC METABOLIC PANEL    EKG None  Radiology No results found.  Procedures Procedures    Medications Ordered in ED Medications - No data to display  ED Course/ Medical Decision Making/ A&P                           Medical Decision Making  This patient presents to the ED for concern of depression, this involves an extensive number of treatment options, and is a complaint that carries with it a high risk of complications and morbidity.  The differential diagnosis includes psychiatric emergency, metabolic abnormality    Additional history obtained:  Additional history obtained from electronic medical record External records from outside source obtained and reviewed including previous behavioral health stay please see HPI   Co morbidities that complicate the patient evaluation  N/A  Social Determinants of Health:  N/A    Lab Tests:  I Ordered, and personally interpreted labs.  The pertinent results include: CBC unremarkable, BMP unremarkable, ethanol less than 10, respiratory panel negative urine rapid drug screen positive for tetra cannabinoids   Reevaluation:  Patient is reevaluated he was resting calmly bed has no complaints, he is agreed for discharge.  Consultations Obtained:  I requested consultation with the TTS awaiting the recommendations for patient's disposition. per [employer] np patient is psychiatrically cleared and can be discharged, antidepressants and Seroquel will not be restarted at this time he will have to follow-up in next 3 days time for further evaluation.  He will be given resources upon his discharge.     Rule out Low  suspicion for systemic infection as patient nontoxic-appearing, vital signs are reassuring and no exudates present CBC.  Low suspicion for metabolic abnormality as BMP is unremarkable.  No suspicion for psychiatric emergency this patient has suicidal or homicidal ideations he does endorse some hallucinations but this is normal for him he is responding appropriately, IVC will be deferred at this time patient does not pose a threat to himself or others and is able to make his own decisions.    Dispostion and problem list  After consideration of the diagnostic results and the patients response to treatment, I feel that the patent would benefit from  Depression-we will have patient follow-up with outpatient resources provided by psychiatric team, given strict return precautions.Marland Kitchen  Final Clinical Impression(s) / ED Diagnoses Final diagnoses:  None    Rx / DC Orders ED Discharge Orders     None         Carroll SageFaulkner, Zuma Hust J, PA-C 08/09/21 1156    Derwood KaplanNanavati, Ankit, MD 08/09/21 1539

## 2021-08-09 NOTE — ED Notes (Signed)
Pts belongings are in closet on shelf

## 2021-08-09 NOTE — ED Triage Notes (Signed)
Pt brought to ED via [facility] for SI. Pt is voluntary at this time. Pt states he has been having trouble with his girlfriend and has been having these thoughts since Thanksgiving. Pt states he has gotten a job at [employer] and he has been happy that he got that job and that made him happy. Pt does not have a specific plan. Pt states he was in [place] for 1 week back in June/July and has not had his medications recently. Pt states sometimes he feels like the pressure and stress is too much. Pt is calm and cooperative at this time.
